# Patient Record
Sex: Female | Born: 1955 | Race: White | Hispanic: Refuse to answer | Marital: Single | Smoking: Former smoker
Health system: Southern US, Community
[De-identification: ages and names within clinical notes are randomized; demographics above are authoritative.]

## PROBLEM LIST (undated history)

## (undated) DIAGNOSIS — K219 Gastro-esophageal reflux disease without esophagitis: Secondary | ICD-10-CM

## (undated) DIAGNOSIS — L509 Urticaria, unspecified: Secondary | ICD-10-CM

## (undated) DIAGNOSIS — F419 Anxiety disorder, unspecified: Secondary | ICD-10-CM

## (undated) DIAGNOSIS — T7840XA Allergy, unspecified, initial encounter: Secondary | ICD-10-CM

## (undated) DIAGNOSIS — F32A Depression, unspecified: Secondary | ICD-10-CM

## (undated) DIAGNOSIS — F329 Major depressive disorder, single episode, unspecified: Secondary | ICD-10-CM

## (undated) HISTORY — PX: OTHER SURGICAL HISTORY: SHX169

## (undated) HISTORY — DX: Allergy, unspecified, initial encounter: T78.40XA

## (undated) HISTORY — DX: Anxiety disorder, unspecified: F41.9

## (undated) HISTORY — PX: TONGUE BIOPSY: SHX1075

## (undated) HISTORY — DX: Urticaria, unspecified: L50.9

---

## 2000-03-02 ENCOUNTER — Inpatient Hospital Stay (HOSPITAL_COMMUNITY): Admission: AD | Admit: 2000-03-02 | Discharge: 2000-03-02 | Payer: Self-pay | Admitting: Obstetrics & Gynecology

## 2000-04-13 ENCOUNTER — Other Ambulatory Visit: Admission: RE | Admit: 2000-04-13 | Discharge: 2000-04-13 | Payer: Self-pay | Admitting: Obstetrics & Gynecology

## 2001-05-25 ENCOUNTER — Other Ambulatory Visit: Admission: RE | Admit: 2001-05-25 | Discharge: 2001-05-25 | Payer: Self-pay | Admitting: Obstetrics & Gynecology

## 2002-12-06 ENCOUNTER — Other Ambulatory Visit: Admission: RE | Admit: 2002-12-06 | Discharge: 2002-12-06 | Payer: Self-pay | Admitting: Obstetrics & Gynecology

## 2004-01-02 ENCOUNTER — Other Ambulatory Visit: Admission: RE | Admit: 2004-01-02 | Discharge: 2004-01-02 | Payer: Self-pay | Admitting: Obstetrics & Gynecology

## 2004-09-19 ENCOUNTER — Ambulatory Visit (HOSPITAL_COMMUNITY): Admission: RE | Admit: 2004-09-19 | Discharge: 2004-09-19 | Payer: Self-pay | Admitting: Orthopedic Surgery

## 2004-09-23 ENCOUNTER — Ambulatory Visit (HOSPITAL_COMMUNITY): Admission: RE | Admit: 2004-09-23 | Discharge: 2004-09-23 | Payer: Self-pay | Admitting: Orthopedic Surgery

## 2004-10-24 ENCOUNTER — Ambulatory Visit (HOSPITAL_BASED_OUTPATIENT_CLINIC_OR_DEPARTMENT_OTHER): Admission: RE | Admit: 2004-10-24 | Discharge: 2004-10-24 | Payer: Self-pay | Admitting: Otolaryngology

## 2004-10-24 ENCOUNTER — Encounter (INDEPENDENT_AMBULATORY_CARE_PROVIDER_SITE_OTHER): Payer: Self-pay | Admitting: Specialist

## 2004-10-24 ENCOUNTER — Ambulatory Visit (HOSPITAL_COMMUNITY): Admission: RE | Admit: 2004-10-24 | Discharge: 2004-10-24 | Payer: Self-pay | Admitting: Otolaryngology

## 2004-10-29 ENCOUNTER — Encounter: Admission: RE | Admit: 2004-10-29 | Discharge: 2004-10-29 | Payer: Self-pay | Admitting: Otolaryngology

## 2004-12-05 ENCOUNTER — Encounter (INDEPENDENT_AMBULATORY_CARE_PROVIDER_SITE_OTHER): Payer: Self-pay | Admitting: *Deleted

## 2004-12-05 ENCOUNTER — Encounter: Payer: Self-pay | Admitting: Gastroenterology

## 2004-12-05 ENCOUNTER — Ambulatory Visit (HOSPITAL_COMMUNITY): Admission: RE | Admit: 2004-12-05 | Discharge: 2004-12-06 | Payer: Self-pay | Admitting: Otolaryngology

## 2004-12-31 ENCOUNTER — Encounter: Payer: Self-pay | Admitting: Gastroenterology

## 2005-01-13 ENCOUNTER — Encounter: Payer: Self-pay | Admitting: Gastroenterology

## 2005-01-18 ENCOUNTER — Encounter: Payer: Self-pay | Admitting: Gastroenterology

## 2005-01-22 ENCOUNTER — Other Ambulatory Visit: Admission: RE | Admit: 2005-01-22 | Discharge: 2005-01-22 | Payer: Self-pay | Admitting: Obstetrics & Gynecology

## 2010-06-13 ENCOUNTER — Encounter (INDEPENDENT_AMBULATORY_CARE_PROVIDER_SITE_OTHER): Payer: Self-pay | Admitting: *Deleted

## 2010-07-28 ENCOUNTER — Ambulatory Visit: Payer: Self-pay | Admitting: Gastroenterology

## 2010-07-28 DIAGNOSIS — K219 Gastro-esophageal reflux disease without esophagitis: Secondary | ICD-10-CM | POA: Insufficient documentation

## 2010-07-28 DIAGNOSIS — R1310 Dysphagia, unspecified: Secondary | ICD-10-CM | POA: Insufficient documentation

## 2010-07-28 DIAGNOSIS — R197 Diarrhea, unspecified: Secondary | ICD-10-CM | POA: Insufficient documentation

## 2010-07-29 ENCOUNTER — Ambulatory Visit: Payer: Self-pay | Admitting: Gastroenterology

## 2010-08-05 ENCOUNTER — Encounter: Payer: Self-pay | Admitting: Gastroenterology

## 2010-08-07 ENCOUNTER — Telehealth: Payer: Self-pay | Admitting: Gastroenterology

## 2010-08-12 DIAGNOSIS — K222 Esophageal obstruction: Secondary | ICD-10-CM | POA: Insufficient documentation

## 2010-08-25 ENCOUNTER — Telehealth: Payer: Self-pay | Admitting: Gastroenterology

## 2010-08-27 ENCOUNTER — Encounter: Payer: Self-pay | Admitting: Gastroenterology

## 2010-10-28 ENCOUNTER — Ambulatory Visit
Admission: RE | Admit: 2010-10-28 | Discharge: 2010-10-28 | Payer: Self-pay | Source: Home / Self Care | Attending: Gastroenterology | Admitting: Gastroenterology

## 2010-10-28 ENCOUNTER — Encounter: Payer: Self-pay | Admitting: Gastroenterology

## 2010-11-04 NOTE — Procedures (Signed)
Summary: Upper Endoscopy  Patient: Karen White Note: All result statuses are Final unless otherwise noted.  Tests: (1) Upper Endoscopy (EGD)   EGD Upper Endoscopy       DONE     [facility]     [address]     [place], [place]  [address]           ENDOSCOPY PROCEDURE REPORT           PATIENT:  Karen White, Karen White  MR#:  [id number]     BIRTHDATE:  [birth year], 53 yrs. old  GENDER:  female           ENDOSCOPIST:  Barbette Hair. Arlyce Dice, MD     Referred by:           PROCEDURE DATE:  07/29/2010     PROCEDURE:  EGD with biopsy, 432[address], Maloney Dilation of Esophagus     ASA CLASS:  Class II     INDICATIONS:  reflux symptoms despite therapy, dysphagia           MEDICATIONS:   Fentanyl 75 mcg IV, Versed 9 mg IV, glycopyrrolate     (Robinal) 0.2 mg IV, 0.6cc simethancone 0.6 cc PO     TOPICAL ANESTHETIC:  Exactacain Spray           DESCRIPTION OF PROCEDURE:   After the risks benefits and     alternatives of the procedure were thoroughly explained, informed     consent was obtained.  The LB GIF-H180 [id number] endoscope was     introduced through the mouth and advanced to the third portion of     the duodenum, without limitations.  The instrument was slowly     withdrawn as the mucosa was fully examined.     <<PROCEDUREIMAGES>>           Esophagitis was found. z line at 36cm, slightly irregular     (possible erosions) Bxs taken to r/o barrett's esophagus (see     image2 and image3).  A stricture was found at the gastroesophageal     junction. Early esophageal stricture Dilation with maloney dilator     18mm Minor resistance; no heme  Otherwise the examination was     normal.    Retroflexed views revealed no abnormalities.    The     scope was then withdrawn from the patient and the procedure     completed.           COMPLICATIONS:  None           ENDOSCOPIC IMPRESSION:     1) Esophagitis     2) Stricture at the gastroesophageal junction - s/p maloney     dilitation     3)  Otherwise normal examination     RECOMMENDATIONS:     1) Await biopsy results     2) continue PPI - DEXILANT     3) Call office next 2-3 days to schedule an office appointment     for 3-4 weeks           REPEAT EXAM:  No           ______________________________     Barbette Hair. Arlyce Dice, MD           CC:  Samuel Jester           n.     eSIGNED:   Barbette Hair. Kaplan at 07/29/2010 04:29 PM           [place],  [name], [name] [phone]  Note: An exclamation mark (!) indicates a result that was not dispersed into the flowsheet. Document Creation Date: 07/29/2010 4:29 PM _______________________________________________________________________  (1) Order result status: Final Collection or observation date-time: 07/29/2010 16:22 Requested date-time:  Receipt date-time:  Reported date-time:  Referring Physician:   Ordering Physician: Melvia Heaps [id number]) Specimen Source:  Source: Launa Grill Order Number: [id number] Lab site:   Appended Document: Upper Endoscopy    Clinical Lists Changes  Problems: Added new problem of ESOPHAGEAL STRICTURE (ICD-530.3)

## 2010-11-04 NOTE — Medication Information (Signed)
Summary: Prior Auth/[employer]  Prior Auth/medco   Imported By: Lester [place] 09/05/2010 10:58:25  _____________________________________________________________________  External Attachment:    Type:   Image     Comment:   External Document

## 2010-11-04 NOTE — Progress Notes (Signed)
Summary: med concern  Phone Note Call from Patient Call back at [phone]   Caller: Patient Call For: Dr. Arlyce Dice Reason for Call: Talk to Nurse Summary of Call: having trouble getting Dexilant filled... [employer], [phone] Initial call taken by: Vallarie Mare,  August 25, 2010 3:46 PM  Follow-up for Phone Call        Called at pharmacy they are to fax over a prior authorization request. Follow-up by: Merri Ray CMA Duncan Dull),  August 25, 2010 4:31 PM  Additional Follow-up for Phone Call Additional follow up Details #1::        Tried to contact pt, no answer Additional Follow-up by: Merri Ray CMA Duncan Dull),  August 25, 2010 4:32 PM    Additional Follow-up for Phone Call Additional follow up Details #2::    Pt will come ppick u[p samples tomorrow before 2pm. Until her medication is approved through her insurance Follow-up by: Merri Ray CMA Duncan Dull),  August 26, 2010 9:17 AM

## 2010-11-04 NOTE — Op Note (Signed)
Summary: [place]  [place]   Imported By: Sherian Rein 07/31/2010 10:32:32  _____________________________________________________________________  External Attachment:    Type:   Image     Comment:   External Document

## 2010-11-04 NOTE — Assessment & Plan Note (Signed)
Summary: persistant GERD--ch.   History of Present Illness Visit Type: Initial Consult Primary GI MD: Melvia Heaps MD [facility] Primary Provider: Samuel Jester, MD Requesting Provider: Samuel Jester, MD Chief Complaint: Persistent GERD History of Present Illness:   Karen White is a  pleasant 55 year old white female referred at the request of Dr. Charm Barges for evaluation of throat burning and diarrhea.  For the past year she has been suffering from severe throat burning associated with hoarseness, pyrosis and coughing.  She has intermittent dysphagia to solids.  She has taken various PPIs including Nexium and Prilosec.  She is on no gastric irritants including nonsteroidals.  She complains of excess gas, bloating and belching.  For the past year she has also been complaining of frequent diarrhea.  Symptoms are intermittent consisting of watery stools with urgency.  She has kept her food diary and cannot pinpoint any particular food stuffs.  There is no history of melena or hematochezia.  Symptoms are coincident with her upper GI complaints.   GI Review of Systems    Reports acid reflux, belching, and  bloating.      Denies abdominal pain, chest pain, dysphagia with liquids, dysphagia with solids, heartburn, loss of appetite, nausea, vomiting, vomiting blood, weight loss, and  weight gain.      Reports irritable bowel syndrome.     Denies anal fissure, black tarry stools, change in bowel habit, constipation, diarrhea, diverticulosis, fecal incontinence, heme positive stool, hemorrhoids, jaundice, light color stool, liver problems, rectal bleeding, and  rectal pain. Preventive Screening-Counseling & Management  Alcohol-Tobacco     Smoking Status: never      Drug Use:  no.      Current Medications (verified): 1)  Prilosec 20 Mg Cpdr (Omeprazole) .Marland Kitchen.. 1 By Mouth Once Daily 2)  Nexium 40 Mg Cpdr (Esomeprazole Magnesium) .Marland Kitchen.. 1 By Mouth Two Times A Day 3)  Acidophilus  Caps (Lactobacillus) .Marland Kitchen..  1 By Mouth Once Daily 4)  Lexapro 10 Mg Tabs (Escitalopram Oxalate) .Marland Kitchen.. 1 By Mouth Once Daily 5)  Gianvi 3-0.02 Mg Tabs (Drospirenone-Ethinyl Estradiol) .Marland Kitchen.. 1 By Mouth Once Daily 6)  Maxalt 10 Mg Tabs (Rizatriptan Benzoate) .... Take As Needed Headache 7)  Alprazolam 0.5 Mg Tabs (Alprazolam) .... Take As Needed At Bedtime Sleep  Allergies (verified): No Known Drug Allergies  Past History:  Past Medical History: Chronic Headaches GERD  Past Surgical History: Tongue biopsy x 3  Removal of Schwannoma Tumor  Family History: Reviewed history and no changes required. Family History of Heart Disease: father Vulvar Ca and Lung Cancer-mother No FH of Colon Cancer: Family History of Ovarian Cancer:MGM  Social History: Reviewed history and no changes required. divorced 2 girls Patient has never smoked.  Alcohol Use - yes   1 weekly Daily Caffeine Use  1-3 per day Illicit Drug Use - no Smoking Status:  never Drug Use:  no  Review of Systems       The patient complains of cough.  The patient denies allergy/sinus, anemia, anxiety-new, arthritis/joint pain, back pain, blood in urine, breast changes/lumps, change in vision, confusion, coughing up blood, depression-new, fainting, fatigue, fever, headaches-new, hearing problems, heart murmur, heart rhythm changes, itching, menstrual pain, muscle pains/cramps, night sweats, nosebleeds, pregnancy symptoms, shortness of breath, skin rash, sleeping problems, sore throat, swelling of feet/legs, swollen lymph glands, thirst - excessive , urination - excessive , urination changes/pain, urine leakage, vision changes, and voice change.         All other systems were reviewed and  were negative   Vital Signs:  Patient profile:   55 year old female Height:      62 inches Weight:      149 pounds BMI:     27.35 Pulse rate:   84 / minute Pulse rhythm:   regular BP sitting:   122 / 80  (left arm)  Vitals Entered By: Milford Cage NCMA (July 28, 2010 3:56 PM)  Physical Exam  Additional Exam:  On physical exam she is a well-developed well-nourished female  skin: anicteric HEENT: normocephalic; PEERLA; no nasal or pharyngeal abnormalities neck: supple nodes: no cervical lymphadenopathy chest: clear to ausculatation and percussion heart: no murmurs, gallops, or rubs abd: soft, nontender; BS normoactive; no abdominal masses, tenderness, organomegaly rectal: deferred ext: no cynanosis, clubbing, edema skeletal: no deformities neuro: oriented x 3; no focal abnormalities    Impression & Recommendations:  Problem # 1:  ESOPHAGEAL REFLUX (ICD-530.81) Patient appears to have symptoms thus far refractory to medical therapy.  Recommendations #1 DEXILANT 60 mg one to 2 times a day.  Her first dose will be before dinner #2 upper endoscopy  #3 antireflux measures  Problem # 2:  DYSPHAGIA UNSPECIFIED (ICD-787.20) r/o early esophageal stricture Orders: EGD (EGD) with dilitation as indicated  Problem # 3:  DIARRHEA (ICD-787.91) Tthe patient's symptoms are very suggestive of IBS.  At the same time, her symptoms could be related to her high-dose PPI therapy.  Recommendations #1 switch from Nexium and Prilosec to once a day DEXILANT #2 to consider colonoscopy pending  response to #1.  At the same time, the patient should  undergo colonoscopy for colorectal cancer screening, which will be done at a later point.  Patient Instructions: 1)  Copy sent to : Samuel Jester, MD 2)  Your EGD is scheduled on 07/29/2010 at 3:30pm 3)  We are giving you Dexilant samples today 4)  The medication list was reviewed and reconciled.  All changed / newly prescribed medications were explained.  A complete medication list was provided to the patient / caregiver.

## 2010-11-04 NOTE — Letter (Signed)
Summary: [facility]   Imported By: Sherian Rein 07/31/2010 10:35:33  _____________________________________________________________________  External Attachment:    Type:   Image     Comment:   External Document

## 2010-11-04 NOTE — Progress Notes (Signed)
Summary: results request  Phone Note Call from Patient Call back at [phone]   Caller: Patient Call For: Dr. Arlyce Dice Reason for Call: Talk to Nurse Summary of Call: would like biopsy results and to discuss getting an rx for Dexilant Initial call taken by: Vallarie Mare,  August 07, 2010 11:30 AM  Follow-up for Phone Call        I have left a voicemail for the patient that I have sent her a rx for dexilant.  I reviewed her bx were fine and only showed inflammation.  She is asked to call back for any questions or concerns. Follow-up by: Darcey Nora RN, CGRN,  August 07, 2010 11:36 AM    Prescriptions: DEXILANT 60 MG CPDR (DEXLANSOPRAZOLE) 1 by mouth once daily  #30 x 6   Entered by:   Darcey Nora RN, CGRN   Authorized by:   Louis Meckel MD   Signed by:   Darcey Nora RN, CGRN on 08/07/2010   Method used:   Electronically to        [facility] (retail)       [address]       [place], [place]  [address]       Ph: [phone]       Fax: [phone]   RxID:   [id number]

## 2010-11-04 NOTE — Letter (Signed)
Summary: Results Letter  [facility]  [address] [place], [place] [address]   Phone: [phone]  Fax: [phone]        July 28, 2010 MRN: [id number]    [facility] [place] [address] [place], [place]  [address]    Dear Ms. Cullifer,  It is my pleasure to have treated you recently as a new patient in my office. I appreciate your confidence and the opportunity to participate in your care.  Since I do have a busy inpatient endoscopy schedule and office schedule, my office hours vary weekly. I am, however, available for emergency calls everyday through my office. If I am not available for an urgent office appointment, another one of our gastroenterologist will be able to assist you.  My well-trained staff are prepared to help you at all times. For emergencies after office hours, a physician from our Gastroenterology section is always available through my 24 hour answering service  Once again I welcome you as a new patient and I look forward to a happy and healthy relationship             Sincerely,  Louis Meckel MD  This letter has been electronically signed by your physician.  Appended Document: Results Letter LETTER MAILED

## 2010-11-04 NOTE — Letter (Signed)
Summary: Results Letter  [facility]  [address] [place], [place] [address]   Phone: [phone]  Fax: [phone]        August 05, 2010 MRN: [id number]    [facility] [place] [address] [place], [place]  [address]    Dear Ms. Abrahamsen,   Your biopsies demonstrated inflammatory changes only.    Please follow the recommendations previously discussed.  Should you have any immediate concerns or questions, feel free to contact me at the office.    Sincerely,  Barbette Hair. Arlyce Dice, M.D., [employer]          Sincerely,  Louis Meckel MD  This letter has been electronically signed by your physician.  Appended Document: Results Letter letter mailed

## 2010-11-04 NOTE — Letter (Signed)
Summary: [facility]   Imported By: Sherian Rein 07/31/2010 10:30:19  _____________________________________________________________________  External Attachment:    Type:   Image     Comment:   External Document

## 2010-11-04 NOTE — Medication Information (Signed)
Summary: Approved Dexilant / [employer]  Approved Dexilant / [employer]   Imported By: Lennie Odor 09/04/2010 10:47:18  _____________________________________________________________________  External Attachment:    Type:   Image     Comment:   External Document

## 2010-11-04 NOTE — Letter (Signed)
Summary: New Patient letter  [facility]  [address] [place], [place] [address]   Phone: [phone]  Fax: [phone]       06/13/2010 MRN: [id number]  [facility] [place] [address] [place], [place]  [address]  Dear Ms. Todisco,  Welcome to the Gastroenterology Division at [facility].    You are scheduled to see Dr.  Arlyce Dice on 07-28-10 at 3:45P.M. on the 3rd floor at [facility], [address]  We ask that you try to arrive at our office 15 minutes prior to your appointment time to allow for check-in.  We would like you to complete the enclosed self-administered evaluation form prior to your visit and bring it with you on the day of your appointment.  We will review it with you.  Also, please bring a complete list of all your medications or, if you prefer, bring the medication bottles and we will list them.  Please bring your insurance card so that we may make a copy of it.  If your insurance requires a referral to see a specialist, please bring your referral form from your primary care physician.  Co-payments are due at the time of your visit and may be paid by cash, check or credit card.     Your office visit will consist of a consult with your physician (includes a physical exam), any laboratory testing he/she may order, scheduling of any necessary diagnostic testing (e.g. x-ray, ultrasound, CT-scan), and scheduling of a procedure (e.g. Endoscopy, Colonoscopy) if required.  Please allow enough time on your schedule to allow for any/all of these possibilities.    If you cannot keep your appointment, please call [phone] to cancel or reschedule prior to your appointment date.  This allows [place] the opportunity to schedule an appointment for another patient in need of care.  If you do not cancel or reschedule by 5 p.m. the business day prior to your appointment date, you will be charged a $50.00 late cancellation/no-show fee.    Thank you for choosing  [facility] for your medical needs.  We appreciate the opportunity to care for you.  Please visit [place] at our website  to learn more about our practice.                     Sincerely,                                                             The Gastroenterology Division

## 2010-11-04 NOTE — Letter (Signed)
Summary: NP Eval/[facility]  NP Eval/[facility]   Imported By: Sherian Rein 07/31/2010 10:33:55  _____________________________________________________________________  External Attachment:    Type:   Image     Comment:   External Document

## 2010-11-04 NOTE — Letter (Signed)
Summary: EGD Instructions  [facility]  [address] [place], [place] [address]   Phone: [phone]  Fax: [phone]       Karen White    Oct 23, 1955    MRN: [id number]       Procedure Day /Date:TUESDAY 07/29/2010     Arrival Time: 2:30PM     Procedure Time:3:30PM     Location of Procedure:                    X  [facility] (4th Floor)  PREPARATION FOR ENDOSCOPY   On10/25 THE DAY OF THE PROCEDURE:  1.   No solid foods, milk or milk products are allowed after midnight the night before your procedure.  2.   Do not drink anything colored red or purple.  Avoid juices with pulp.  No orange juice.  3.  You may drink clear liquids until12:30PM, which is 2 hours before your procedure.                                                                                                CLEAR LIQUIDS INCLUDE: Water Jello Ice Popsicles Tea (sugar ok, no milk/cream) Powdered fruit flavored drinks Coffee (sugar ok, no milk/cream) Gatorade Juice: apple, white grape, white cranberry  Lemonade Clear bullion, consomm, broth Carbonated beverages (any kind) Strained chicken noodle soup Hard Candy   MEDICATION INSTRUCTIONS  Unless otherwise instructed, you should take regular prescription medications with a small sip of water as early as possible the morning of your procedure.           OTHER INSTRUCTIONS  You will need a responsible adult at least 54 years of age to accompany you and drive you home.   This person must remain in the waiting room during your procedure.  Wear loose fitting clothing that is easily removed.  Leave jewelry and other valuables at home.  However, you may wish to bring a book to read or an iPod/MP3 player to listen to music as you wait for your procedure to start.  Remove all body piercing jewelry and leave at home.  Total time from sign-in until discharge is approximately 2-3 hours.  You should go home directly after  your procedure and rest.  You can resume normal activities the day after your procedure.  The day of your procedure you should not:   Drive   Make legal decisions   Operate machinery   Drink alcohol   Return to work  You will receive specific instructions about eating, activities and medications before you leave.    The above instructions have been reviewed and explained to me by   _______________________    I fully understand and can verbalize these instructions _____________________________ Date _________

## 2010-11-06 NOTE — Assessment & Plan Note (Signed)
Summary: follow-up office visit   History of Present Illness Visit Type: Follow-up Visit Primary GI MD: Melvia Heaps MD [facility] Primary Provider: Samuel Jester, MD Requesting Provider: Samuel Jester, MD Chief Complaint: follow-up procedure  better on Dexilant History of Present Illness:   [name] has returned following endoscopy for reflux symptoms.  Esophagitis was seen.  On a regimen of DEXILANT symptoms have significantly improved.  This includes her diarrhea and cough.  An early esophageal stricture was dilated to 18 mm.   GI Review of Systems      Denies abdominal pain, acid reflux, belching, bloating, chest pain, dysphagia with liquids, dysphagia with solids, heartburn, loss of appetite, nausea, vomiting, vomiting blood, weight loss, and  weight gain.        Denies anal fissure, black tarry stools, change in bowel habit, constipation, diarrhea, diverticulosis, fecal incontinence, heme positive stool, hemorrhoids, irritable bowel syndrome, jaundice, light color stool, liver problems, rectal bleeding, and  rectal pain.    Current Medications (verified): 1)  Dexilant 60 Mg Cpdr (Dexlansoprazole) .Marland Kitchen.. 1 By Mouth Once Daily 2)  Acidophilus  Caps (Lactobacillus) .Marland Kitchen.. 1 By Mouth Once Daily 3)  Lexapro 10 Mg Tabs (Escitalopram Oxalate) .Marland Kitchen.. 1 By Mouth Once Daily 4)  Gianvi 3-0.02 Mg Tabs (Drospirenone-Ethinyl Estradiol) .Marland Kitchen.. 1 By Mouth Once Daily 5)  Maxalt 10 Mg Tabs (Rizatriptan Benzoate) .... Take As Needed Headache 6)  Alprazolam 0.5 Mg Tabs (Alprazolam) .... Take As Needed At Bedtime Sleep 7)  Prednisone 20 Mg Tabs (Prednisone) .Marland Kitchen.. 1 By Mouth Once Daily  Allergies (verified): 1)  ! Laren Everts  Past History:  Past Medical History: Reviewed history from 07/28/2010 and no changes required. Chronic Headaches GERD  Past Surgical History: Reviewed history from 07/28/2010 and no changes required. Tongue biopsy x 3  Removal of Schwannoma Tumor  Family History: Reviewed  history from 07/28/2010 and no changes required. Family History of Heart Disease: father Vulvar Ca and Lung Cancer-mother No FH of Colon Cancer: Family History of Ovarian Cancer:MGM  Social History: Reviewed history from 07/28/2010 and no changes required. divorced 2 girls Patient has never smoked.  Alcohol Use - yes   1 weekly Daily Caffeine Use  1-3 per day Illicit Drug Use - no  Review of Systems  The patient denies allergy/sinus, anemia, anxiety-new, arthritis/joint pain, back pain, blood in urine, breast changes/lumps, change in vision, confusion, cough, coughing up blood, depression-new, fainting, fatigue, fever, headaches-new, hearing problems, heart murmur, heart rhythm changes, itching, menstrual pain, muscle pains/cramps, night sweats, nosebleeds, pregnancy symptoms, shortness of breath, skin rash, sleeping problems, sore throat, swelling of feet/legs, swollen lymph glands, thirst - excessive , urination - excessive , urination changes/pain, urine leakage, vision changes, and voice change.    Vital Signs:  Patient profile:   55 year old female Height:      62 inches Weight:      150 pounds BMI:     27.53 Pulse rate:   84 / minute Pulse rhythm:   regular BP sitting:   118 / 70  (left arm)  Vitals Entered By: Milford Cage NCMA (October 28, 2010 3:16 PM)   Impression & Recommendations:  Problem # 1:  ESOPHAGEAL REFLUX (ICD-530.81) Assessment Improved Plan to continue DEXILANT 60 mg daily  Problem # 2:  ESOPHAGEAL STRICTURE (ICD-530.3) Assessment: Improved Repeat dilatation p.r.n.  Problem # 3:  DIARRHEA (ICD-787.91) Assessment: Improved  Problem # 4:  SPECIAL SCREENING FOR MALIGNANT NEOPLASMS COLON (ICD-V76.51) Plan screening colonoscopy  Patient Instructions: 1)  Copy sent to : Samuel Jester, MD 2)  Please call the office to schedule your screening colonoscopy at your convenience that was recomended by Dr Arlyce Dice 3)  The medication list was reviewed and  reconciled.  All changed / newly prescribed medications were explained.  A complete medication list was provided to the patient / caregiver.

## 2011-02-20 NOTE — Op Note (Signed)
NAMELENEA, BYWATER               ACCOUNT NO.:  [id number]   MEDICAL RECORD NO.:  [id number]          PATIENT TYPE:  OIB   LOCATION:  5741                         FACILITY:  [facility]   PHYSICIAN:  Lucky Cowboy, MD         DATE OF BIRTH:  13-Jul-1956   DATE OF PROCEDURE:  12/05/2004  DATE OF DISCHARGE:                                 OPERATIVE REPORT   PREOPERATIVE DIAGNOSIS:  Tongue base mass.   POSTOPERATIVE DIAGNOSIS:  Tongue base mass.   PROCEDURE:  Tongue base biopsy.   SURGEON:  Lucky Cowboy, MD   ANESTHESIA:  General endotracheal anesthesia.   ESTIMATED BLOOD LOSS:  Less than 30 cc.   SPECIMEN:  Biopsies of tongue base mass   ANESTHESIA:  General.   COMPLICATIONS:  None.   INDICATIONS:  The patient is a 55 year old female who was noted to have a  large left-sided tongue base mass on incidental finding for a MRI for  cervical spine symptoms.  A previous biopsy was without abnormality and for  this reason, the patient is returned to the operating room for further  biopsying.   FINDINGS:  The patient was noted to have a large palpable left-sided tongue  base mass.  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   PROCEDURE:  The patient was taken to the operating room and placed on the  table in the supine position.  She was then placed under general  endotracheal anesthesia and a bite block placed in between the left central  incisors.  A towel clip was placed in the tongue base and extended  anteriorly.  The mass was palpated at just the left side posterior to the  circumvallate papillae.  Bovie cautery was used to make an incision in the  anterior-to-posterior direction of approximately 1 cm.  Large cup forceps  were used to biopsy down onto the palpable mass.  Initially, this was  returned as atypical squamous mucosa and normal tongue muscle.  Again,  deeper biopsies to palpation were performed.  A more granular and definite  mass was  palpated and biopsied again.  Further, the ultrasound was used to  definitively identify the site and the ultrasound was placed over the left  submandibular triangle with biopsy forceps placed within the depth of the  mass.  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  The final biopsy ultrasound-guided specimen was sent  for permanent section.  The patient was then awakened from anesthesia and  taken to the postanesthesia care unit in stable condition.  There were no  complications.      SJ/MEDQ  D:  12/05/2004  T:  12/06/2004  Job:  [id number]   cc:   Katy Fitch. Naaman Plummer., M.D.  [address]  [place]  [place] [address]  Fax: [phone]   [place], [place] Gardner Candle DDS   c/o Dr. Waldon Merl, [place]

## 2011-02-20 NOTE — Op Note (Signed)
NAMECALEEN, Karen White               ACCOUNT NO.:  [id number]   MEDICAL RECORD NO.:  [id number]          PATIENT TYPE:  OUT   LOCATION:  DFTL                         FACILITY:  [facility]   PHYSICIAN:  Lucky Cowboy, MD         DATE OF BIRTH:  [birth year]   DATE OF PROCEDURE:  10/24/2004  DATE OF DISCHARGE:  10/24/2004                                 OPERATIVE REPORT   PREOPERATIVE DIAGNOSIS:  Tongue base mass.   POSTOPERATIVE DIAGNOSIS:  Tongue base mass.   PROCEDURE:  Direct laryngoscopy with tongue base biopsy.   SURGEON:  Lucky Cowboy, MD   ANESTHESIA:  General.   ESTIMATED BLOOD LOSS:  Less than 30 cc.   SPECIMENS:  Tongue base biopsy.   COMPLICATIONS:  None.   INDICATIONS:  This patient is a 55 year old female who was noted to have an  incidental finding of a left-sided tongue base mass, which measured  approximately 3 cm in AP direction by 2 cm in transverse direction.  This  was noted incidentally while working up the cervical spine by MRI scan.  The  patient is currently asymptomatic.  For these reasons, the patient is taken  to the operating room for further evaluation.   FINDINGS:  The patient was noted to have a large, firm bimanual palpable  mass.  Frozen sections were without abnormality.   DESCRIPTION OF PROCEDURE:  The patient was taken to the operating room and  placed on the table in the supine position.  She was then placed under  general endotracheal anesthesia and the table rotated counter clockwise 90  degrees.  Head and body were draped in the usual fashion.  Upper dental  guard was placed.  The neck was gently extended using a shoulder roll.  Anterior commissure Holinger laryngoscope was used to visualize the  endolarynx.  There were no obvious abnormalities identified.  There was  moderate asymmetry of a left tongue base.  Bimanual palpation of the left  submandibular triangle with one hand at the left tongue base, revealed that  you could feel the mass  between the two tips of the fingers.  With  compression on the left side in the submandibular triangle, cup forceps were  used to bury into the deep lingual tonsil and obtain deep bites within the  left tongue base.  Frozen section revealed benign squamous mucosa and  lymphoid tissue.  Also, Tru-Cut forceps were applied in this area, with an  attempt to go deeper.  This revealed benign skeletal muscle.   At this point, a cutdown was made using a laryngeal sickle knife over the  tongue base on the left lingual tonsillar area.  The large cup forceps were  used to enter the tongue base with bimanual compression.  The mass was  entered  in this fashion.  Permanent sections were taken.  There was minimal  bleeding.  The table was then rotated clockwise 90 degrees to its original  position.  The patient was awakened from anesthesia and taken to the post-  anesthesia care unit in stable condition.  There were  no complications.      SJ/MEDQ  D:  11/17/2004  T:  11/17/2004  Job:  [id number]   cc:   Katy Fitch. Naaman Plummer., M.D.  [address]  [place]  [place] [address]  Fax: [phone]

## 2011-08-21 ENCOUNTER — Ambulatory Visit (HOSPITAL_COMMUNITY)
Admission: RE | Admit: 2011-08-21 | Discharge: 2011-08-21 | Disposition: A | Discharge status: Home / Self Care | Payer: BC Managed Care – PPO | Source: Ambulatory Visit | Attending: Physical Medicine and Rehabilitation | Admitting: Physical Medicine and Rehabilitation

## 2011-08-21 DIAGNOSIS — M6281 Muscle weakness (generalized): Secondary | ICD-10-CM | POA: Insufficient documentation

## 2011-08-21 DIAGNOSIS — IMO0001 Reserved for inherently not codable concepts without codable children: Secondary | ICD-10-CM | POA: Insufficient documentation

## 2011-08-21 DIAGNOSIS — M542 Cervicalgia: Secondary | ICD-10-CM | POA: Insufficient documentation

## 2011-08-21 NOTE — Progress Notes (Signed)
Physical Therapy Evaluation  Patient Details  Name: Karen White MRN: [id number] Date of Birth: 1956/06/09  Today's Date: 08/21/2011 Time: 4098-1191 Time Calculation (min): 43 min Charges: 1 eval Visit#: 1  of 8   Re-eval: 09/20/11    Past Medical History: No past medical history on file. Past Surgical History: No past surgical history on file.  Subjective Symptoms/Limitations Symptoms: Pt is a 55 y.o.female referred to PT for cervical pain.  Pt reports that her pain started in late July and the pain is on the R side of her neck.  She has tried over the door traction,,e-stim, massage and has sought help from a chiropractor and has not had any relief.  It runs from the base of her neck to her shoulder.  She is having difficulty getting comfortable at night.  She is taking Flexiril. Does not feel like it is a migraine.  She has sharp shooting pains. No dizziness. R handed numbness.MEDICAL HISTORY: Migraines, Family history of Mythenia Gravis Special Tests: + Distraction, + Compression, + Bakody's, + Phalens + Roo's Pain Assessment Currently in Pain?: Yes Pain Score:   6 Pain Location: Neck Pain Orientation: Right Pain Type: Acute pain  Precautions/Restrictions     Prior Functioning     Sensation/Coordination/Flexibility    Assessment    Exercise/Treatments Stretches Upper Trapezius Stretch: 30 seconds Levator Stretch: 30 seconds Corner Stretch: 30 seconds Neck Exercises Neck Retraction: 10 reps W Back: 10 reps X to V: 10 reps  Physical Therapy Assessment and Plan PT Assessment and Plan Clinical Impression Statement: Pt is a 55 y.o.female  referred to PT for cervical pain.  After examination it was found that the patient has current body structure impairments of increased pain, decreased ROM, decreased postural strength, impaired posture and increased scalene spams, which are limiting her ability to participate in community and work related activities.  Pt will  benefit from skilled PT service to address the above body structure impairments in order to maximize function in order to improve quality of life. Rehab Potential: Good PT Frequency: Min 2X/week PT Duration: 4 weeks PT Treatment/Interventions: Therapeutic exercise;Neuromuscular re-education;Patient/family education PT Plan: Add: UBE, x-v, w-backs, motor training activities    Goals    Problem List Patient Active Problem List  Diagnoses  . ESOPHAGEAL STRICTURE  . ESOPHAGEAL REFLUX  . DYSPHAGIA UNSPECIFIED  . DIARRHEA  . Cervicalgia    PT - End of Session Activity Tolerance: Patient tolerated treatment well   Escher Harr 08/21/2011, 7:17 PM  Physician Documentation Your signature is required to indicate approval of the treatment plan as stated above.  Please sign and either send electronically or make a copy of this report for your files and return this physician signed original.   Please mark one 1.__approve of plan  2. ___approve of plan with the following conditions.   ______________________________                                                          _____________________ Physician Signature  Date  

## 2011-08-25 ENCOUNTER — Ambulatory Visit (HOSPITAL_COMMUNITY): Payer: BC Managed Care – PPO | Admitting: Physical Therapy

## 2011-08-31 ENCOUNTER — Ambulatory Visit (HOSPITAL_COMMUNITY)
Admission: RE | Admit: 2011-08-31 | Discharge: 2011-08-31 | Disposition: A | Discharge status: Home / Self Care | Payer: BC Managed Care – PPO | Source: Ambulatory Visit | Attending: *Deleted | Admitting: *Deleted

## 2011-08-31 NOTE — Progress Notes (Signed)
Physical Therapy Treatment Patient Details  Name: ARELI FRARY MRN: [id number] Date of Birth: 05-13-1956  Today's Date: 08/31/2011 Time: 6578-4696 Time Calculation (min): 53 min Visit#: 2  of 8   Re-eval: 09/20/11 Charges: Therex x 34' Manual x 10'  Subjective: Symptoms/Limitations Symptoms: Pt reports some pain in R cervical area that she attributes to looking down at computers at work. Pain Assessment Currently in Pain?: Yes Pain Score:   6 Pain Location: Neck Pain Orientation: Right   Exercise/Treatments Stretches Upper Trapezius Stretch: 3 reps;30 seconds Levator Stretch: 3 reps;30 seconds Corner Stretch: 3 reps;30 seconds Neck Exercises Neck Retraction: 10 reps W Back: 10 reps X to V: 10 reps  Manual Therapy Manual Therapy: Other (comment) Myofascial Release: MFR w/STM to R upper trap to decrease pain/tightness to decrease pain x 10'  Physical Therapy Assessment and Plan PT Assessment and Plan Clinical Impression Statement: Pt with c/o tightness with exercises. Mm tightness noted in R upper trap. MFR w/ STM to R upper trap to decrease pain tightness added after approval by Annett Fabian, PT. Pt states that pain remained at 6/10 at end of session but reports decreased tightness in cervical area. PT Treatment/Interventions: Therapeutic exercise;Other (comment) (Manual) PT Plan: Continue to progress per PT POC.     Problem List Patient Active Problem List  Diagnoses  . ESOPHAGEAL STRICTURE  . ESOPHAGEAL REFLUX  . DYSPHAGIA UNSPECIFIED  . DIARRHEA  . Cervicalgia    PT - End of Session Activity Tolerance: Patient tolerated treatment well General Behavior During Session: [facility] for tasks performed Cognition: [facility] for tasks performed  Antonieta Iba 08/31/2011, 5:47 PM

## 2011-09-02 ENCOUNTER — Telehealth (HOSPITAL_COMMUNITY): Payer: Self-pay

## 2011-09-02 ENCOUNTER — Ambulatory Visit (HOSPITAL_COMMUNITY): Payer: BC Managed Care – PPO | Admitting: Physical Therapy

## 2011-09-07 ENCOUNTER — Ambulatory Visit (HOSPITAL_COMMUNITY): Payer: BC Managed Care – PPO

## 2011-09-09 ENCOUNTER — Ambulatory Visit (HOSPITAL_COMMUNITY): Payer: BC Managed Care – PPO | Admitting: Physical Therapy

## 2011-09-14 ENCOUNTER — Ambulatory Visit (HOSPITAL_COMMUNITY): Payer: BC Managed Care – PPO | Admitting: *Deleted

## 2011-09-14 ENCOUNTER — Telehealth (HOSPITAL_COMMUNITY): Payer: Self-pay

## 2011-09-16 ENCOUNTER — Ambulatory Visit (HOSPITAL_COMMUNITY): Payer: BC Managed Care – PPO | Admitting: Physical Therapy

## 2011-09-21 ENCOUNTER — Ambulatory Visit (HOSPITAL_COMMUNITY): Payer: BC Managed Care – PPO | Admitting: Physical Therapy

## 2011-09-23 ENCOUNTER — Ambulatory Visit (HOSPITAL_COMMUNITY): Payer: BC Managed Care – PPO | Admitting: Physical Therapy

## 2011-10-01 ENCOUNTER — Other Ambulatory Visit: Payer: Self-pay | Admitting: Gastroenterology

## 2011-10-02 MED ORDER — DEXLANSOPRAZOLE 60 MG PO CPDR: DELAYED_RELEASE_CAPSULE | ORAL | Status: DC

## 2011-10-02 NOTE — Telephone Encounter (Signed)
Prior auth for Dexilant approved for 1 year. Called [facility] in [place], [place] [address] and order went thru. lmom for pt to call me back.

## 2011-10-02 NOTE — Telephone Encounter (Signed)
Spoke with pt who stated the pharmacy still call her PCP for prior auth for Dexilant and it is being denied. I informed her we have no samples, but I will try to get the prior auth for her. Called [facility], [phone]; pt id. [id number] and someone had answered no to prior therapy of PPIs. Per 07/28/2010 note, pt has tried Nexium bid and Omeprazole w/o any help.

## 2011-10-05 ENCOUNTER — Telehealth: Payer: Self-pay | Admitting: *Deleted

## 2011-10-05 NOTE — Telephone Encounter (Signed)
Patient approved for Dexilant 09/11/2011 until 10/01/2012

## 2011-12-03 ENCOUNTER — Other Ambulatory Visit: Payer: Self-pay

## 2011-12-03 ENCOUNTER — Emergency Department (HOSPITAL_COMMUNITY): Payer: BC Managed Care – PPO

## 2011-12-03 ENCOUNTER — Encounter (HOSPITAL_COMMUNITY): Payer: Self-pay

## 2011-12-03 ENCOUNTER — Emergency Department (HOSPITAL_COMMUNITY)
Admission: EM | Admit: 2011-12-03 | Discharge: 2011-12-03 | Disposition: A | Discharge status: Home / Self Care | Payer: BC Managed Care – PPO | Attending: Emergency Medicine | Admitting: Emergency Medicine

## 2011-12-03 DIAGNOSIS — R6884 Jaw pain: Secondary | ICD-10-CM | POA: Insufficient documentation

## 2011-12-03 DIAGNOSIS — M25519 Pain in unspecified shoulder: Secondary | ICD-10-CM | POA: Insufficient documentation

## 2011-12-03 DIAGNOSIS — R079 Chest pain, unspecified: Secondary | ICD-10-CM | POA: Insufficient documentation

## 2011-12-03 DIAGNOSIS — K219 Gastro-esophageal reflux disease without esophagitis: Secondary | ICD-10-CM | POA: Insufficient documentation

## 2011-12-03 HISTORY — DX: Major depressive disorder, single episode, unspecified: F32.9

## 2011-12-03 HISTORY — DX: Gastro-esophageal reflux disease without esophagitis: K21.9

## 2011-12-03 HISTORY — DX: Depression, unspecified: F32.A

## 2011-12-03 LAB — COMPREHENSIVE METABOLIC PANEL
ALT: 45 U/L — ABNORMAL HIGH (ref 0–35)
AST: 33 U/L (ref 0–37)
Albumin: 3.8 g/dL (ref 3.5–5.2)
Alkaline Phosphatase: 122 U/L — ABNORMAL HIGH (ref 39–117)
BUN: 21 mg/dL (ref 6–23)
Calcium: 10.1 mg/dL (ref 8.4–10.5)
Chloride: 106 mEq/L (ref 96–112)
GFR calc Af Amer: 70 mL/min — ABNORMAL LOW (ref >90)
GFR calc non Af Amer: 60 mL/min — ABNORMAL LOW (ref >90)
Glucose, Bld: 72 mg/dL (ref 70–99)
Potassium: 3.7 mEq/L (ref 3.5–5.1)
Sodium: 140 mEq/L (ref 135–145)
Total Bilirubin: 0.5 mg/dL (ref 0.3–1.2)
Total Protein: 6.9 g/dL (ref 6.0–8.3)

## 2011-12-03 LAB — CBC
HCT: 42.8 % (ref 36.0–46.0)
Hemoglobin: 14.1 g/dL (ref 12.0–15.0)
MCH: 28.4 pg (ref 26.0–34.0)
MCHC: 32.9 g/dL (ref 30.0–36.0)
MCV: 86.1 fL (ref 78.0–100.0)
Platelets: 215 10*3/uL (ref 150–400)
RBC: 4.97 MIL/uL (ref 3.87–5.11)
RDW: 14.5 % (ref 11.5–15.5)

## 2011-12-03 LAB — POCT I-STAT TROPONIN I: Troponin i, poc: 0 ng/mL (ref 0.00–0.08)

## 2011-12-03 LAB — CARDIAC PANEL(CRET KIN+CKTOT+MB+TROPI)
CK, MB: 2.3 ng/mL (ref 0.3–4.0)
Relative Index: 1.1 (ref 0.0–2.5)
Troponin I: <0.3 ng/mL (ref <0.30)

## 2011-12-03 LAB — TROPONIN I: Troponin I: <0.3 ng/mL (ref <0.30)

## 2011-12-03 MED ORDER — ASPIRIN 81 MG PO CHEW
324.0000 mg | CHEWABLE_TABLET | Freq: Once | ORAL | Status: AC
  Administered 2011-12-03: 324 mg via ORAL
  Filled 2011-12-03: qty 4

## 2011-12-03 MED ORDER — MORPHINE SULFATE 2 MG/ML IJ SOLN
2.0000 mg | Freq: Once | INTRAMUSCULAR | Status: AC
  Administered 2011-12-03: 2 mg via INTRAVENOUS
  Filled 2011-12-03 (×2): qty 1

## 2011-12-03 MED ORDER — ACETAMINOPHEN 500 MG PO TABS
1000.0000 mg | ORAL_TABLET | Freq: Once | ORAL | Status: AC
  Administered 2011-12-03: 1000 mg via ORAL
  Filled 2011-12-03: qty 2

## 2011-12-03 MED ORDER — NITROGLYCERIN 0.4 MG SL SUBL
0.4000 mg | SUBLINGUAL_TABLET | SUBLINGUAL | Status: DC | PRN
  Administered 2011-12-03 (×2): 0.4 mg via SUBLINGUAL
  Filled 2011-12-03: qty 25

## 2011-12-03 NOTE — ED Notes (Signed)
Complain of chest that started around 0400 this morning. States the pain is radiating to her jaw and shoulders

## 2011-12-03 NOTE — ED Provider Notes (Signed)
History   This chart was scribed for Benny Lennert, MD scribed by Magnus Sinning. The patient was seen in room APA10/APA10 seen at 8:45.    CSN: [id number]  Arrival date & time 12/03/11  4098   First MD Initiated Contact with Patient 12/03/11 0845      Chief Complaint  Patient presents with  . Chest Pain    (Consider location/radiation/quality/duration/timing/severity/associated sxs/prior Treatment) Karen White is a 56 y.o. female Patient is a 56 y.o. female presenting with chest pain. The history is provided by the patient. No language interpreter was used.  Chest Pain The chest pain began 3 - 5 hours ago. Chest pain occurs constantly. The chest pain is unchanged. The severity of the pain is moderate. The quality of the pain is described as pressure-like. The pain radiates to the right jaw, left jaw, left shoulder and right shoulder.  Her family medical history is significant for heart disease in family (Father had blockages in 36s. Mother experienced heart diseased in late 65s. Both deceased.).   Patient does not smoke and is a avid runner.  PCP: Prudy Feeler Past Medical History  Diagnosis Date  . Depression   . GERD (gastroesophageal reflux disease)   . Migraine     History reviewed. No pertinent past surgical history.  History reviewed. No pertinent family history.  History  Substance Use Topics  . Smoking status: Not on file  . Smokeless tobacco: Not on file  . Alcohol Use: Yes    Review of Systems  Respiratory: Positive for chest tightness.   Cardiovascular: Positive for chest pain.  Musculoskeletal: Positive for myalgias.  All other systems reviewed and are negative.   Allergies  Review of patient's allergies indicates no known allergies.  Home Medications   Current Outpatient Rx  Name Route Sig Dispense Refill  . DEXLANSOPRAZOLE 60 MG PO CPDR  Take one capsule by mouth daily 30 minutes before breakfast 30 capsule 6    BP 125/72  Pulse 77   Temp(Src) 97.8 F (36.6 C) (Oral)  Resp 18  Ht 5\' 2"  (1.575 m)  Wt 128 lb (58.06 kg)  BMI 23.41 kg/m2  SpO2 100%  Physical Exam  Nursing note and vitals reviewed. Constitutional: She is oriented to person, place, and time. She appears well-developed and well-nourished. No distress.  HENT:  Head: Normocephalic and atraumatic.  Eyes: Conjunctivae and EOM are normal. No scleral icterus.  Neck: Normal range of motion. Neck supple. No thyromegaly present.  Cardiovascular: Normal rate and regular rhythm.  Exam reveals no gallop and no friction rub.   No murmur heard. Pulmonary/Chest: Effort normal. No stridor. She has no wheezes. She has no rales. She exhibits no tenderness.  Abdominal: She exhibits no distension. There is no tenderness. There is no rebound.  Musculoskeletal: Normal range of motion. She exhibits no edema.  Lymphadenopathy:    She has no cervical adenopathy.  Neurological: She is alert and oriented to person, place, and time. Coordination normal.  Skin: Skin is warm and dry. No rash noted. No erythema.  Psychiatric: She has a normal mood and affect. Her behavior is normal.    ED Course  Procedures (including critical care time) DIAGNOSTIC STUDIES: Oxygen Saturation is 100% on room air, normal by my interpretation.    COORDINATION OF CARE: 10:19: EDMD performs recheck. Patient reports feeling better, but does note mild HA. States that chest pressure is relieved. 13:20: Physician performs recheck and notes patient is relieved of sx. He provides recommendations  for follow-up.  ED Medication Orders 8:52:NitroGLYCERIN SL tablet 0.4 mg  9:00:Aspirin chewable tablet 324 mg  Morphine 2 MG/ML injection 2 mg  10:30: Tylenol 1,000 mg  Labs Reviewed  COMPREHENSIVE METABOLIC PANEL - Abnormal; Notable for the following:    ALT 45 (*)    Alkaline Phosphatase 122 (*)    GFR calc non Af Amer 60 (*)    GFR calc Af Amer 70 (*)    All other components within normal limits    CARDIAC PANEL(CRET KIN+CKTOT+MB+TROPI) - Abnormal; Notable for the following:    Total CK 211 (*)    All other components within normal limits  CBC  POCT I-STAT TROPONIN I  TROPONIN I   Dg Chest 2 View  12/03/2011  *RADIOLOGY REPORT*  Clinical Data: Chest pain.  CHEST - 2 VIEW  Comparison: None  Findings: Heart and mediastinal contours are within normal limits. No focal opacities or effusions.  No acute bony abnormality.  IMPRESSION: No active disease.  Original Report Authenticated By: Cyndie Chime, M.D.    No diagnosis found.    Date: 12/03/2011  Rate: 78  Rhythm: normal sinus rhythm  QRS Axis: normal  Intervals: normal  ST/T Wave abnormalities: normal  Conduction Disutrbances:none  Narrative Interpretation:   Old EKG Reviewed: none available   MDM  Chest pain.  Two normal troponins pt chest pain free.  She is to follow up as out pt The chart was scribed for me under my direct supervision.  I personally performed the history, physical, and medical decision making and all procedures in the evaluation of this patient.Benny Lennert, MD 12/03/11 1332  Benny Lennert, MD 12/03/11 [phone]

## 2011-12-03 NOTE — Discharge Instructions (Signed)
Follow up with your md next week.  Take one aspirin a day.  And follow up with [facility] in 2-3 weeks

## 2011-12-14 ENCOUNTER — Encounter: Payer: Self-pay | Admitting: Cardiology

## 2011-12-14 ENCOUNTER — Ambulatory Visit (INDEPENDENT_AMBULATORY_CARE_PROVIDER_SITE_OTHER): Payer: BC Managed Care – PPO | Admitting: Cardiology

## 2011-12-14 VITALS — BP 157/84 | HR 82 | Resp 16 | Ht 62.0 in | Wt 126.0 lb

## 2011-12-14 DIAGNOSIS — R030 Elevated blood-pressure reading, without diagnosis of hypertension: Secondary | ICD-10-CM

## 2011-12-14 DIAGNOSIS — R079 Chest pain, unspecified: Secondary | ICD-10-CM

## 2011-12-14 DIAGNOSIS — K219 Gastro-esophageal reflux disease without esophagitis: Secondary | ICD-10-CM

## 2011-12-14 DIAGNOSIS — R011 Cardiac murmur, unspecified: Secondary | ICD-10-CM

## 2011-12-14 DIAGNOSIS — IMO0001 Reserved for inherently not codable concepts without codable children: Secondary | ICD-10-CM

## 2011-12-14 DIAGNOSIS — R072 Precordial pain: Secondary | ICD-10-CM

## 2011-12-14 NOTE — Assessment & Plan Note (Signed)
Known history. She reports that above symptoms were not typical for her reflux. Question esophageal spasm as possibility as well.

## 2011-12-14 NOTE — Patient Instructions (Signed)
**Note De-Identified Ikaika Showers Obfuscation** Your physician has requested that you have a stress echocardiogram. For further information please visit [email or web address] Please follow instruction sheet as given.  Your physician has requested that you have an echocardiogram. Echocardiography is a painless test that uses sound waves to create images of your heart. It provides your doctor with information about the size and shape of your heart and how well your heart's chambers and valves are working. This procedure takes approximately one hour. There are no restrictions for this procedure.  Your physician recommends that you continue on your current medications as directed. Please refer to the Current Medication list given to you today.  Your physician recommends that you schedule a follow-up appointment in: we will contact you with results of test

## 2011-12-14 NOTE — Progress Notes (Signed)
   Clinical Summary Karen White is a 56 y.o.female referred by Dr. Estell Harpin for cardiology consultation following recent ER visit with chest pain. She states that she awoke at 4 AM on Thursday  2/28 with chest "pressure" and neck discomfort. No reported palpitations or syncope, no dyspnea. She also had a headache. She ultimately went to the ER with persistent symptoms, lasting several hours. She states that NTG was helpful.  Workup in the ER revealed normal troponin I, ECG with small R primes V1-V2, overall normal. CXR reported no acute findings. Total CK was increased although MB normal at 2.3. ALT mildly increased at 45, alkaline phosphatase 122.  She states that she runs 3 days a week, usually around 3 miles at a time. Reports no exertional chest pain or unusual shortness of breath. She has had no recurrent chest pain since original event. Feels somewhat "tired." She has not undergone any previous ischemic testing.  She does have reflux symptoms, states that the above episode was different in general.  No Known Allergies  Current Outpatient Prescriptions  Medication Sig Dispense Refill  . ALPRAZolam (XANAX) 0.5 MG tablet Take 0.5 mg by mouth at bedtime as needed. Sleep      . aspirin 81 MG tablet Take 81 mg by mouth daily.      Marland Kitchen dexlansoprazole (DEXILANT) 60 MG capsule Take one capsule by mouth daily 30 minutes before breakfast  30 capsule  6  . escitalopram (LEXAPRO) 10 MG tablet Take 10 mg by mouth at bedtime.       Marland Kitchen ibuprofen (ADVIL,MOTRIN) 200 MG tablet Take 600 mg by mouth every 6 (six) hours as needed. Pain      . rizatriptan (MAXALT-MLT) 10 MG disintegrating tablet Take 10 mg by mouth as needed. Migraine. May repeat in 2 hours if needed      . topiramate (TOPAMAX) 100 MG tablet Take 100 mg by mouth at bedtime.        Past Medical History  Diagnosis Date  . Depression   . GERD (gastroesophageal reflux disease)   . Migraine     Past Surgical History  Procedure Date  . Tongue  biopsy   . Schwanoma tumor resection     Family History  Problem Relation Age of Onset  . Coronary artery disease Father     Age 44s  . Coronary artery disease Mother     Age 66s  . Cancer Mother     Social History Ms. Sheu reports that she has never smoked. She has never used smokeless tobacco. Ms. Ergle reports that she drinks alcohol.  Review of Systems No sough, fevers, appetite changes, melena or hematochezia. Has lost approximately 20 pounds - intentional. Otherwise negative.  Physical Examination Filed Vitals:   12/14/11 1422  BP: 157/84  Pulse: 82  Resp: 16   Normally nourished appearing woman in NAD. HEENT: Conjunctiva and lids normal, oropharynx clear with moist mucosa. Neck: Supple, no elevated JVP or carotid bruits, no thyromegaly. Lungs: Clear to auscultation, nonlabored breathing at rest. Cardiac: Regular rate and rhythm, no S3, 2/6 systolic murmur, no pericardial rub. Abdomen: Soft, nontender, bowel sounds present, no guarding or rebound. Extremities: No pitting edema, distal pulses 2+. Skin: Warm and dry. Musculoskeletal: No kyphosis. Neuropsychiatric: Alert and oriented x3, affect grossly appropriate.    Problem List and Plan

## 2011-12-14 NOTE — Assessment & Plan Note (Signed)
Noted today without standing history of hypertension. Should keep an eye on this with Ms. Karen White at primary care followup.

## 2011-12-14 NOTE — Assessment & Plan Note (Signed)
Transthoracic echocardiogram arranged to further assess with no prior studies. She states that she has a sister with a heart murmur, no clear family history of valvular disease.

## 2011-12-14 NOTE — Assessment & Plan Note (Signed)
Outlined above with initial workup in ER overall reassuring from cardiac perspective. Description still concerning although her baseline risk profile is relatively low (no known DM2, hyperlipidemia, HTN, no definite premature CAD in family). Plan is to evaluate her further with exercise echocardiogram for objective assessment of ischemia. Will inform her of the results.

## 2011-12-18 ENCOUNTER — Ambulatory Visit (HOSPITAL_COMMUNITY)
Admission: RE | Admit: 2011-12-18 | Discharge: 2011-12-18 | Disposition: A | Discharge status: Home / Self Care | Payer: BC Managed Care – PPO | Source: Ambulatory Visit | Attending: Cardiology | Admitting: Cardiology

## 2011-12-18 DIAGNOSIS — R072 Precordial pain: Secondary | ICD-10-CM

## 2011-12-18 DIAGNOSIS — I517 Cardiomegaly: Secondary | ICD-10-CM

## 2011-12-18 DIAGNOSIS — R011 Cardiac murmur, unspecified: Secondary | ICD-10-CM

## 2011-12-18 DIAGNOSIS — R079 Chest pain, unspecified: Secondary | ICD-10-CM | POA: Insufficient documentation

## 2011-12-18 NOTE — Progress Notes (Signed)
*  PRELIMINARY RESULTS* Echocardiogram Echocardiogram Stress Test has been performed.  Karen White 12/18/2011, 10:21 AM

## 2011-12-18 NOTE — Progress Notes (Signed)
Stress Lab Nurses Notes - [facility]  NELEH MULDOON 12/18/2011  Reason for doing test: Chest Pain Type of test: Stress Echo Nurse performing test: Parke Poisson, RN Nuclear Medicine Tech: Not Applicable Echo Tech: Karrie Doffing MD performing test: Ival Bible & Joni Reining NP Family MD: Zammit Test explained and consent signed: yes IV started: No IV started Symptoms: fatigue Treatment/Intervention: None Reason test stopped: fatigue and reached target HR After recovery IV was: NA Patient to return to Nuc. Med at : NA Patient discharged: Home Patient's Condition upon discharge was: stable Comments: During test peak BP 152/58 & HR 163.  Recovery BP 110/60 & HR 84.  Symptoms resolved in recovery. Erskine Speed T

## 2011-12-18 NOTE — Progress Notes (Signed)
*  PRELIMINARY RESULTS* Echocardiogram 2D Echocardiogram has been performed.  Conrad Sharon 12/18/2011, 9:01 AM

## 2012-01-06 ENCOUNTER — Encounter (INDEPENDENT_AMBULATORY_CARE_PROVIDER_SITE_OTHER): Payer: Self-pay | Admitting: Surgery

## 2012-01-06 ENCOUNTER — Ambulatory Visit (INDEPENDENT_AMBULATORY_CARE_PROVIDER_SITE_OTHER): Payer: BC Managed Care – PPO | Admitting: Surgery

## 2012-01-06 VITALS — BP 125/69 | HR 73 | Temp 98.2°F | Ht 62.0 in | Wt 126.6 lb

## 2012-01-06 DIAGNOSIS — D171 Benign lipomatous neoplasm of skin and subcutaneous tissue of trunk: Secondary | ICD-10-CM | POA: Insufficient documentation

## 2012-01-06 DIAGNOSIS — D1779 Benign lipomatous neoplasm of other sites: Secondary | ICD-10-CM

## 2012-01-06 NOTE — Patient Instructions (Signed)
Return if this lipoma gets larger

## 2012-01-06 NOTE — Progress Notes (Signed)
Chief Complaint:  Mass to the left of midline on the lower back  History of Present Illness:  Karen White is an 56 y.o. female has recently begun running 5 k and losing weight .  She has noticed a soft mass that she has had for several years that she is more aware of with her reduced weight.    Past Medical History  Diagnosis Date  . Depression   . GERD (gastroesophageal reflux disease)   . Migraine     Past Surgical History  Procedure Date  . Tongue biopsy   . Schwanoma tumor resection   . Mercury posion from fillings     Current Outpatient Prescriptions  Medication Sig Dispense Refill  . ALPRAZolam (XANAX) 0.5 MG tablet Take 0.5 mg by mouth at bedtime as needed. Sleep      . aspirin 81 MG tablet Take 81 mg by mouth daily.      Marland Kitchen dexlansoprazole (DEXILANT) 60 MG capsule Take one capsule by mouth daily 30 minutes before breakfast  30 capsule  6  . escitalopram (LEXAPRO) 10 MG tablet Take 10 mg by mouth at bedtime.       . rizatriptan (MAXALT-MLT) 10 MG disintegrating tablet Take 10 mg by mouth as needed. Migraine. May repeat in 2 hours if needed      . topiramate (TOPAMAX) 100 MG tablet Take 100 mg by mouth at bedtime.       Review of patient's allergies indicates no known allergies. Family History  Problem Relation Age of Onset  . Coronary artery disease Father     Age 59s  . Heart disease Father   . Coronary artery disease Mother     Age 60s  . Cancer Mother   . Cancer Maternal Grandmother     ovarian   Social History:   reports that she quit smoking about 36 years ago. She has never used smokeless tobacco. She reports that she drinks alcohol. She reports that she does not use illicit drugs.   REVIEW OF SYSTEMS - PERTINENT POSITIVES ONLY: Non contributory  Physical Exam:   Blood pressure 125/69, pulse 73, temperature 98.2 F (36.8 C), temperature source Temporal, height 5\' 2"  (1.575 m), weight 126 lb 9.6 oz (57.425 kg), SpO2 99.00%. Body mass index is 23.16  kg/(m^2).  Gen:  WDWN white female NAD  Neurological: Alert and oriented to person, place, and time. Motor and sensory function is grossly intact  Head: Normocephalic and atraumatic.  Eyes: Conjunctivae are normal. Pupils are equal, round, and reactive to light. No scleral icterus.  Neck: Normal range of motion. Neck supple. No tracheal deviation or thyromegaly present.  Cardiovascular:  SR without murmurs or gallops.  No carotid bruits Respiratory: Effort normal.  No respiratory distress. No chest wall tenderness. Breath sounds normal.  No wheezes, rales or rhonchi.  Abdomen:  neg GU: Back: About 1 fb to the left of midline around L1 there is a soft mass that is freely moveable, about2 cm in diameter and is consistent with a lipoma Musculoskeletal: Normal range of motion. Extremities are nontender. No cyanosis, edema or clubbing noted Lymphadenopathy: No cervical, preauricular, postauricular or axillary adenopathy is present Skin: Skin is warm and dry. No rash noted. No diaphoresis. No erythema. No pallor. Pscyh: Normal mood and affect. Behavior is normal. Judgment and thought content normal.   LABORATORY RESULTS: No results found for this or any previous visit (from the past 48 hour(s)).  RADIOLOGY RESULTS: No results found.  Problem List:  Patient Active Problem List  Diagnoses  . ESOPHAGEAL STRICTURE  . ESOPHAGEAL REFLUX  . DYSPHAGIA UNSPECIFIED  . DIARRHEA  . Cervicalgia  . Precordial pain  . Elevated blood pressure  . 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  We discussed management and she would like to observe this and I would concur.     Matt B. Daphine Deutscher, MD, [facility] [phone] beeper [phone]  01/06/2012 4:40 PM

## 2012-04-11 ENCOUNTER — Encounter (HOSPITAL_COMMUNITY): Payer: Self-pay

## 2012-06-02 IMAGING — CR DG CHEST 2V
2 series · 2 of 2 positions shown · non-contrast
Comparison: None

CLINICAL DATA: Chest pain.

CHEST - 2 VIEW

[view not recorded (1 of 2)]
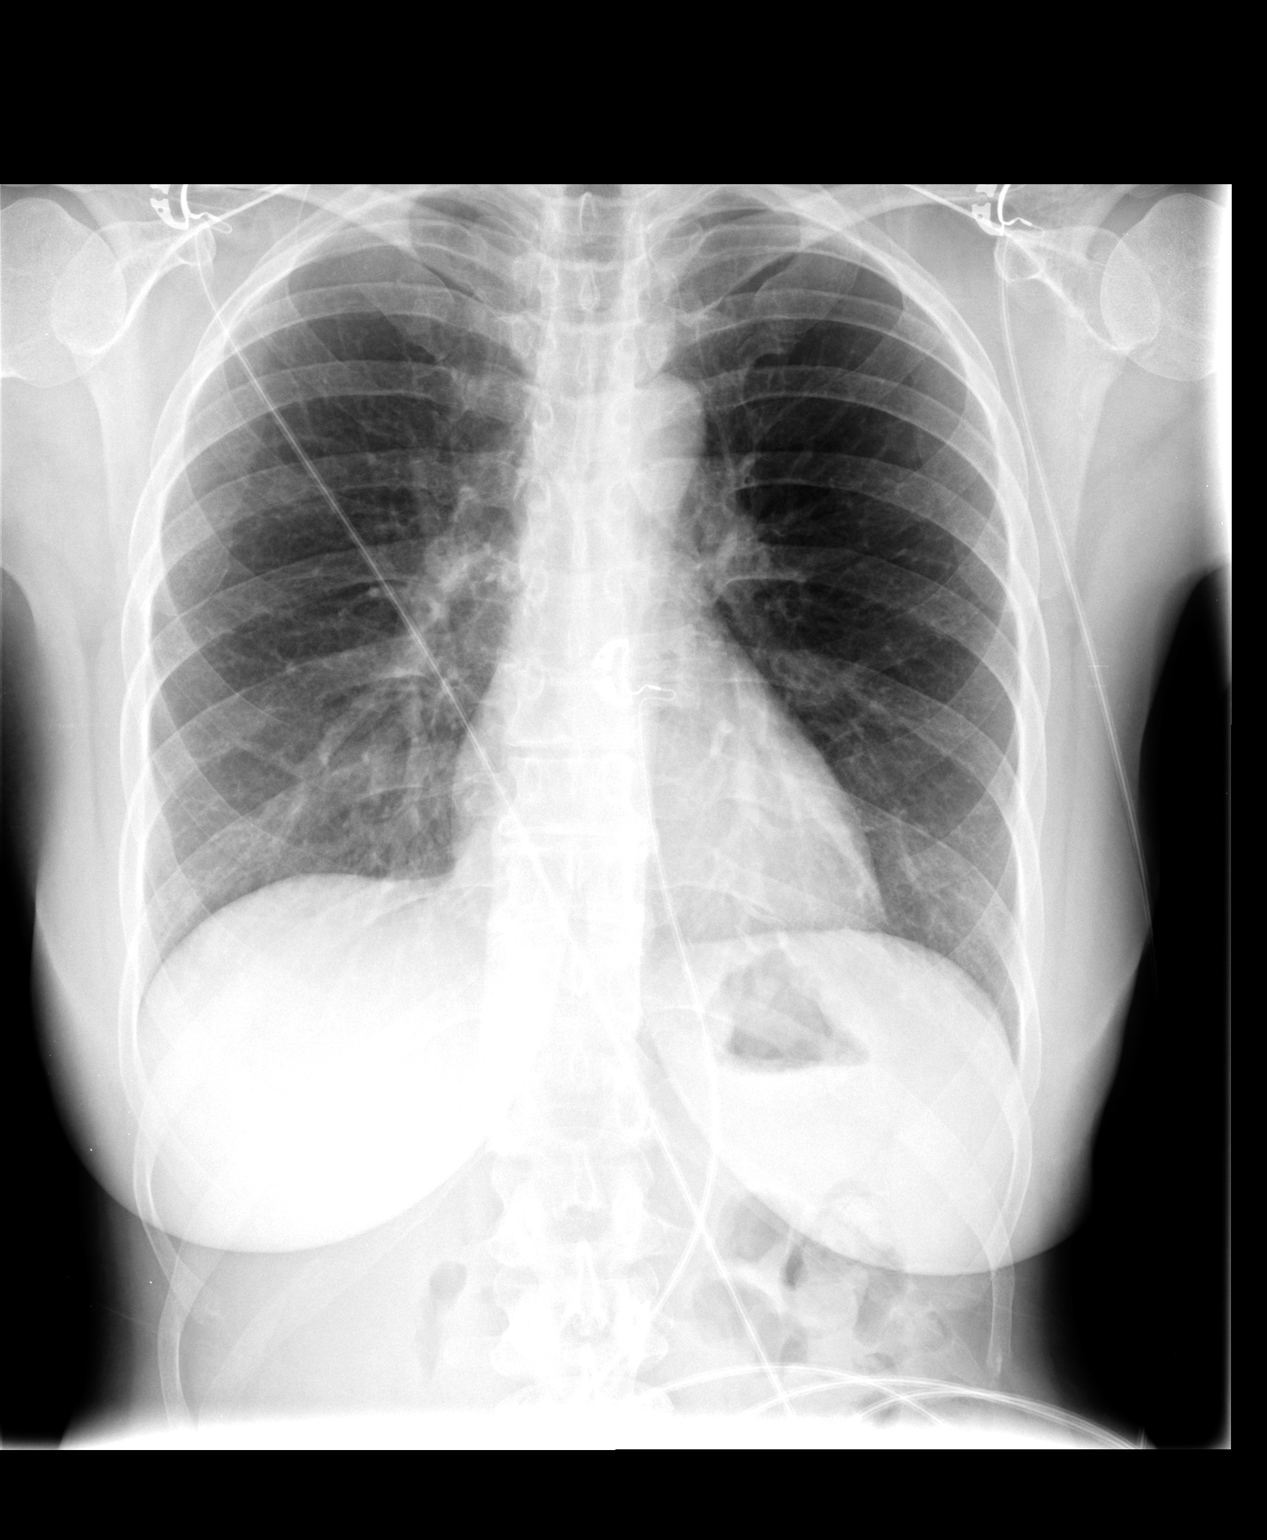

[view not recorded (2 of 2)]
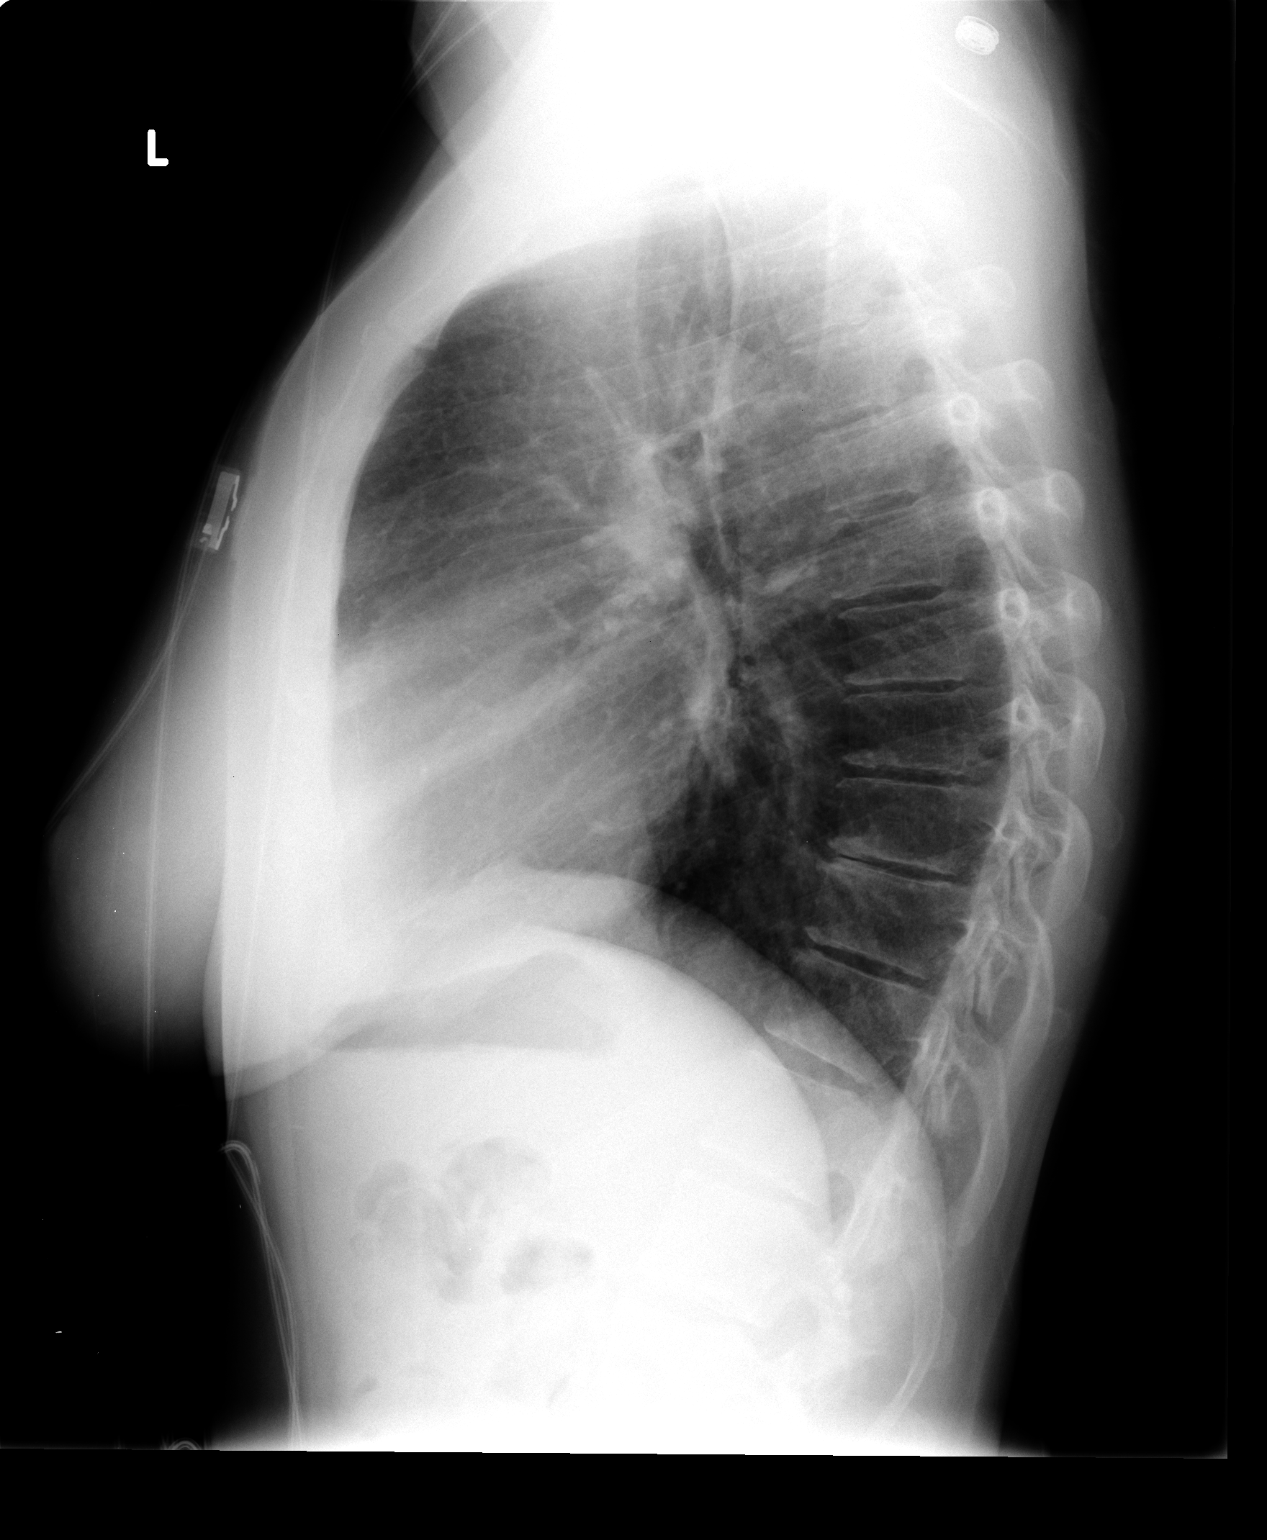

[2 of 2 positions shown; findings below may reference images not displayed]

FINDINGS: Heart and mediastinal contours are within normal limits.
No focal opacities or effusions.  No acute bony abnormality.
IMPRESSION: No active disease.

## 2012-12-16 ENCOUNTER — Other Ambulatory Visit: Payer: Self-pay | Admitting: Otolaryngology

## 2014-03-21 ENCOUNTER — Other Ambulatory Visit: Payer: Self-pay | Admitting: Physician Assistant

## 2014-06-06 ENCOUNTER — Encounter: Payer: Self-pay | Admitting: Gastroenterology

## 2014-08-01 ENCOUNTER — Other Ambulatory Visit: Payer: Self-pay | Admitting: Obstetrics and Gynecology

## 2014-08-03 LAB — CYTOLOGY - PAP

## 2014-08-15 ENCOUNTER — Other Ambulatory Visit: Payer: Self-pay | Admitting: Obstetrics and Gynecology

## 2014-08-16 LAB — CYTOLOGY - PAP

## 2015-09-05 ENCOUNTER — Encounter (HOSPITAL_COMMUNITY): Payer: BC Managed Care – PPO

## 2015-11-11 ENCOUNTER — Encounter: Payer: Self-pay | Admitting: Gastroenterology

## 2015-11-26 ENCOUNTER — Ambulatory Visit (AMBULATORY_SURGERY_CENTER): Payer: Self-pay | Admitting: *Deleted

## 2015-11-26 VITALS — Ht 61.0 in | Wt 123.6 lb

## 2015-11-26 DIAGNOSIS — Z1211 Encounter for screening for malignant neoplasm of colon: Secondary | ICD-10-CM

## 2015-11-26 MED ORDER — SUPREP BOWEL PREP KIT 17.5-3.13-1.6 GM/177ML PO SOLN: 1.0000 | Freq: Once | ORAL | Status: DC

## 2015-11-26 NOTE — Progress Notes (Signed)
Patient denies any allergies to egg or soy products. Patient denies complications with anesthesia/sedation.  Patient denies oxygen use at home and denies diet medications. Emmi instructions for colonoscopy explained and given to patient.  

## 2015-12-10 ENCOUNTER — Encounter: Payer: Self-pay | Admitting: Gastroenterology

## 2015-12-10 ENCOUNTER — Ambulatory Visit (AMBULATORY_SURGERY_CENTER): Payer: BC Managed Care – PPO | Admitting: Gastroenterology

## 2015-12-10 VITALS — BP 105/61 | HR 63 | Temp 96.6°F | Resp 18 | Ht 61.0 in | Wt 123.0 lb

## 2015-12-10 DIAGNOSIS — Z1211 Encounter for screening for malignant neoplasm of colon: Secondary | ICD-10-CM

## 2015-12-10 MED ORDER — SODIUM CHLORIDE 0.9 % IV SOLN
500.0000 mL | INTRAVENOUS | Status: DC
Start: 2015-12-10 — End: 2015-12-10

## 2015-12-10 NOTE — Patient Instructions (Signed)

## 2015-12-10 NOTE — Progress Notes (Signed)
Patient awakening,vss,report to rn 

## 2015-12-10 NOTE — Op Note (Signed)
[place]  [address]. [place], [address]   COLONOSCOPY PROCEDURE REPORT  PATIENT: Karen White, Karen White  MR#: [id number] BIRTHDATE: [birth year] , 82  yrs. old GENDER: female ENDOSCOPIST: Yetta Flock, MD REFERRED BY: PROCEDURE DATE:  12/10/2015 PROCEDURE:   Colonoscopy, screening First Screening Colonoscopy - Avg.  risk and is 50 yrs.  old or older Yes.  Prior Negative Screening - Now for repeat screening. N/A  History of Adenoma - Now for follow-up colonoscopy & has been > or = to 3 yrs.  N/A  Polyps removed today? No Recommend repeat exam, <10 yrs? No ASA CLASS:   Class II INDICATIONS:Screening for colonic neoplasia and Colorectal Neoplasm Risk Assessment for this procedure is average risk. MEDICATIONS: Propofol 300 mg IV  DESCRIPTION OF PROCEDURE:   After the risks benefits and alternatives of the procedure were thoroughly explained, informed consent was obtained.  The digital rectal exam revealed no abnormalities of the rectum.   The LB [id number]  endoscope was introduced through the anus and advanced to the cecum, which was identified by both the appendix and ileocecal valve. No adverse events experienced.   The quality of the prep was adequate  The instrument was then slowly withdrawn as the colon was fully examined. Estimated blood loss is zero unless otherwise noted in this procedure report.    COLON FINDINGS: Mild diverticulosis was noted in the left colon, with some restricted mobility in the sigmoid colon due to diverticulosis.  The remainder of the examined colon was otherwise normal.  No polyps or mass lesions noted.  Hypertrophied anal papillae were noted on retroflexion.     The time to cecum = 5.2 Withdrawal time = 9.1   The scope was withdrawn and the procedure completed. COMPLICATIONS: There were no immediate complications.  ENDOSCOPIC IMPRESSION: Mild diverticulosis of the left colon No polyps Hypertrophied anal  papillae  RECOMMENDATIONS: Resume diet Resume medications Repeat colon cancer screening in 10 years  eSigned:  Yetta Flock, MD 12/10/2015 10:06 AM   cc:  the patient

## 2015-12-11 ENCOUNTER — Telehealth: Payer: Self-pay | Admitting: Emergency Medicine

## 2015-12-11 NOTE — Telephone Encounter (Signed)
  Follow up Call-  Call back number 12/10/2015  Post procedure Call Back phone  # [phone]  Permission to leave phone message Yes     Patient questions:  Do you have a fever, pain , or abdominal swelling? No. Pain Score  0 *  Have you tolerated food without any problems? Yes.    Have you been able to return to your normal activities? Yes.    Do you have any questions about your discharge instructions: Diet   No. Medications  No. Follow up visit  No.  Do you have questions or concerns about your Care? No.  Actions: * If pain score is 4 or above: No action needed, pain <4.

## 2016-06-03 ENCOUNTER — Other Ambulatory Visit: Payer: Self-pay | Admitting: Physician Assistant

## 2016-06-04 NOTE — Telephone Encounter (Signed)
Pt requesting refill on Topamax, ok to refill?

## 2016-08-14 ENCOUNTER — Other Ambulatory Visit: Payer: Self-pay | Admitting: Physician Assistant

## 2016-09-11 ENCOUNTER — Other Ambulatory Visit: Payer: Self-pay | Admitting: Physician Assistant

## 2016-09-11 NOTE — Telephone Encounter (Signed)
Pt requesting refill on Maxalt No OV at [facility]

## 2016-09-18 ENCOUNTER — Ambulatory Visit (INDEPENDENT_AMBULATORY_CARE_PROVIDER_SITE_OTHER): Payer: BC Managed Care – PPO | Admitting: Physician Assistant

## 2016-09-18 ENCOUNTER — Encounter: Payer: Self-pay | Admitting: Physician Assistant

## 2016-09-18 VITALS — BP 129/79 | HR 67 | Temp 96.6°F | Ht 61.0 in | Wt 132.0 lb

## 2016-09-18 DIAGNOSIS — J209 Acute bronchitis, unspecified: Secondary | ICD-10-CM | POA: Diagnosis not present

## 2016-09-18 DIAGNOSIS — Z780 Asymptomatic menopausal state: Secondary | ICD-10-CM

## 2016-09-18 MED ORDER — PREDNISONE 10 MG (21) PO TBPK: ORAL_TABLET | ORAL | 0 refills | Status: DC

## 2016-09-18 MED ORDER — AZITHROMYCIN 250 MG PO TABS: ORAL_TABLET | ORAL | 0 refills | Status: DC

## 2016-09-18 NOTE — Patient Instructions (Signed)

## 2016-09-21 DIAGNOSIS — J209 Acute bronchitis, unspecified: Secondary | ICD-10-CM | POA: Insufficient documentation

## 2016-09-21 DIAGNOSIS — Z780 Asymptomatic menopausal state: Secondary | ICD-10-CM | POA: Insufficient documentation

## 2016-09-21 NOTE — Progress Notes (Signed)
BP 129/79   Pulse 67   Temp (!) 96.6 F (35.9 C) (Oral)   Ht 5\' 1"  (1.549 m)   Wt 132 lb (59.9 kg)   BMI 24.94 kg/m    Subjective:    Patient ID: Karen White, female    DOB: 1956/09/21, 60 y.o.   MRN: [id number]  HPI: Karen White is a 60 y.o. female presenting on 09/18/2016 for Cough and Nasal Congestion Patient has had greater than one week cough and congestion. She has had history of bronchitis in the past. She has had some copious drainage. Her cough is very bad at night. She denies any severe wheezing at this time. She denies any nausea vomiting or diarrhea.  Patient here to be established as new patient at [place].  This patient is known to me from [facility]. This patient comes in for periodic recheck on medications and conditions. All medications are reviewed today. There are no reports of any problems with the medications. All of the medical conditions are reviewed and updated.  Lab work is reviewed and will be ordered as medically necessary.    Past Medical History:  Diagnosis Date  . Allergy   . Anxiety   . Depression   . GERD (gastroesophageal reflux disease)   . Migraine    Relevant past medical, surgical, family and social history reviewed and updated as indicated. Interim medical history since our last visit reviewed. Allergies and medications reviewed and updated. DATA REVIEWED: CHART IN EPIC  Social History   Social History  . Marital status: Divorced    Spouse name: N/A  . Number of children: N/A  . Years of education: N/A   Occupational History  . Not on file.   Social History Main Topics  . Smoking status: Former Smoker    Packs/day: 0.50    Types: Cigarettes    Quit date: 01/06/1976  . Smokeless tobacco: Never Used  . Alcohol use 1.2 oz/week    2 Glasses of wine per week     Comment: One drink a week  . Drug use: No  . Sexual activity: Yes    Birth control/ protection: Post-menopausal   Other  Topics Concern  . Not on file   Social History Narrative  . No narrative on file    Past Surgical History:  Procedure Laterality Date  . mercury posion from fillings    . Schwanoma tumor resection    . TONGUE BIOPSY      Family History  Problem Relation Age of Onset  . Coronary artery disease Father     Age 81s  . Heart disease Father   . Coronary artery disease Mother     Age 38s  . Cancer Mother   . Cancer Maternal Grandmother     ovarian  . Colon cancer Neg Hx   . Colon polyps Neg Hx   . Esophageal cancer Neg Hx   . Rectal cancer Neg Hx   . Stomach cancer Neg Hx     Review of Systems  Constitutional: Positive for chills and fatigue. Negative for activity change and appetite change.  HENT: Positive for congestion, postnasal drip and sore throat.   Eyes: Negative.   Respiratory: Positive for cough and wheezing.   Cardiovascular: Negative.  Negative for chest pain, palpitations and leg swelling.  Gastrointestinal: Negative.   Genitourinary: Negative.   Musculoskeletal: Negative.   Skin: Negative.   Neurological: Positive for headaches.    Allergies  as of 09/18/2016   No Known Allergies     Medication List       Accurate as of 09/18/16 11:59 PM. Always use your most recent med list.          ALPRAZolam 0.5 MG tablet Commonly known as:  XANAX Take 0.5 mg by mouth at bedtime as needed. Sleep   aspirin 81 MG tablet Take 81 mg by mouth daily. Reported on 11/26/2015   azithromycin 250 MG tablet Commonly known as:  ZITHROMAX Take as directed   cholecalciferol 1000 units tablet Commonly known as:  VITAMIN D Place 2,000 Units under the tongue daily.   escitalopram 20 MG tablet Commonly known as:  LEXAPRO TAKE ONE (1) TABLET EACH DAY   estradiol 0.05 MG/24HR patch Commonly known as:  VIVELLE-DOT Place 1 patch onto the skin 2 (two) times a week.   GNP CALCIUM 1200 PO Take by mouth.   predniSONE 10 MG (21) Tbpk tablet Commonly known as:  STERAPRED  UNI-PAK 21 TAB As directed x 6 days   progesterone 100 MG capsule Commonly known as:  PROMETRIUM   rizatriptan 10 MG disintegrating tablet Commonly known as:  MAXALT-MLT PLACE 1 TABLET ON TONGUE AND ALLOW TO DISSOLVE AS NEEDED FOR ONSET OFHEADACHE   topiramate 100 MG tablet Commonly known as:  TOPAMAX Take 100 mg by mouth at bedtime.          Objective:    BP 129/79   Pulse 67   Temp (!) 96.6 F (35.9 C) (Oral)   Ht 5\' 1"  (1.549 m)   Wt 132 lb (59.9 kg)   BMI 24.94 kg/m   No Known Allergies  Wt Readings from Last 3 Encounters:  09/18/16 132 lb (59.9 kg)  12/10/15 123 lb (55.8 kg)  11/26/15 123 lb 9.6 oz (56.1 kg)    Physical Exam  Constitutional: She is oriented to person, place, and time. She appears well-developed and well-nourished.  HENT:  Head: Normocephalic and atraumatic.  Right Ear: A middle ear effusion is present.  Left Ear: A middle ear effusion is present.  Nose: Mucosal edema present. Right sinus exhibits no frontal sinus tenderness. Left sinus exhibits no frontal sinus tenderness.  Mouth/Throat: Posterior oropharyngeal erythema present. No oropharyngeal exudate or tonsillar abscesses.  Eyes: Conjunctivae and EOM are normal. Pupils are equal, round, and reactive to light.  Neck: Normal range of motion.  Cardiovascular: Normal rate, regular rhythm, normal heart sounds and intact distal pulses.   Pulmonary/Chest: Effort normal and breath sounds normal.  Abdominal: Soft. Bowel sounds are normal.  Neurological: She is alert and oriented to person, place, and time. She has normal reflexes.  Skin: Skin is warm and dry. No rash noted.  Psychiatric: She has a normal mood and affect. Her behavior is normal. Judgment and thought content normal.  Nursing note and vitals reviewed.     Assessment & Plan:   1. Acute bronchitis, unspecified organism - azithromycin (ZITHROMAX) 250 MG tablet; Take as directed  Dispense: 6 tablet; Refill: 0 - predniSONE (STERAPRED  UNI-PAK 21 TAB) 10 MG (21) TBPK tablet; As directed x 6 days  Dispense: 21 tablet; Refill: 0  2. Menopause - estradiol (VIVELLE-DOT) 0.05 MG/24HR patch; Place 1 patch onto the skin 2 (two) times a week. - progesterone (PROMETRIUM) 100 MG capsule;    Continue all other maintenance medications as listed above.  Follow up plan: Return if symptoms worsen or fail to improve.  Educational handout given for bronchitis  Terald Sleeper  PA-C [place]  [place], [place] [address] [phone]   09/21/2016, 10:02 PM

## 2016-10-10 ENCOUNTER — Other Ambulatory Visit: Payer: Self-pay | Admitting: Physician Assistant

## 2016-12-17 ENCOUNTER — Other Ambulatory Visit: Payer: Self-pay | Admitting: Physician Assistant

## 2017-01-25 ENCOUNTER — Other Ambulatory Visit: Payer: Self-pay | Admitting: Physician Assistant

## 2017-03-29 ENCOUNTER — Other Ambulatory Visit: Payer: Self-pay | Admitting: Physician Assistant

## 2017-03-29 NOTE — Telephone Encounter (Signed)
Last seen 09/18/16  Glenard Haring  If approved route to nurse to call into  [facility]

## 2017-03-31 NOTE — Telephone Encounter (Signed)
Rx called in 

## 2017-06-15 ENCOUNTER — Other Ambulatory Visit: Payer: Self-pay | Admitting: Physician Assistant

## 2017-06-16 NOTE — Telephone Encounter (Signed)
Rx phoned in.   

## 2017-09-15 ENCOUNTER — Telehealth: Payer: Self-pay | Admitting: Physician Assistant

## 2017-09-15 NOTE — Telephone Encounter (Signed)
Made apt for the 14th with Jones.

## 2017-09-17 ENCOUNTER — Encounter: Payer: Self-pay | Admitting: Physician Assistant

## 2017-09-17 ENCOUNTER — Other Ambulatory Visit: Payer: Self-pay | Admitting: Physician Assistant

## 2017-09-17 ENCOUNTER — Ambulatory Visit: Payer: BC Managed Care – PPO | Admitting: Physician Assistant

## 2017-09-17 VITALS — BP 136/82 | HR 67 | Temp 97.2°F | Ht 61.0 in | Wt 124.0 lb

## 2017-09-17 DIAGNOSIS — Z0000 Encounter for general adult medical examination without abnormal findings: Secondary | ICD-10-CM

## 2017-09-17 DIAGNOSIS — G43809 Other migraine, not intractable, without status migrainosus: Secondary | ICD-10-CM | POA: Diagnosis not present

## 2017-09-17 MED ORDER — RIZATRIPTAN BENZOATE 10 MG PO TBDP: ORAL_TABLET | ORAL | 5 refills | Status: DC

## 2017-09-17 MED ORDER — TOPIRAMATE 50 MG PO TABS: 150.0000 mg | ORAL_TABLET | Freq: Every day | ORAL | 3 refills | Status: DC

## 2017-09-17 NOTE — Patient Instructions (Signed)
In a few days you may receive a survey in the mail or online from [employer] regarding your visit with us today. Please take a moment to fill this out. Your feedback is very important to our whole office. It can help us better understand your needs as well as improve your experience and satisfaction. Thank you for taking your time to complete it. We care about you.  Kristoph Sattler, PA-C  

## 2017-09-18 LAB — CBC WITH DIFFERENTIAL/PLATELET
BASOS ABS: 0 10*3/uL (ref 0.0–0.2)
BASOS: 0 %
EOS (ABSOLUTE): 0.1 10*3/uL (ref 0.0–0.4)
Eos: 2 %
HEMOGLOBIN: 14.2 g/dL (ref 11.1–15.9)
Hematocrit: 43.8 % (ref 34.0–46.6)
Immature Grans (Abs): 0 10*3/uL (ref 0.0–0.1)
Immature Granulocytes: 0 %
LYMPHS ABS: 2.4 10*3/uL (ref 0.7–3.1)
LYMPHS: 46 %
MCH: 29.2 pg (ref 26.6–33.0)
MCHC: 32.4 g/dL (ref 31.5–35.7)
MCV: 90 fL (ref 79–97)
Monocytes Absolute: 0.5 10*3/uL (ref 0.1–0.9)
Monocytes: 9 %
NEUTROS ABS: 2.3 10*3/uL (ref 1.4–7.0)
Neutrophils: 43 %
Platelets: 225 10*3/uL (ref 150–379)
RBC: 4.86 x10E6/uL (ref 3.77–5.28)
RDW: 14.1 % (ref 12.3–15.4)
WBC: 5.4 10*3/uL (ref 3.4–10.8)

## 2017-09-18 LAB — CMP14+EGFR
A/G RATIO: 2.1 (ref 1.2–2.2)
ALK PHOS: 83 IU/L (ref 39–117)
ALT: 13 IU/L (ref 0–32)
AST: 17 IU/L (ref 0–40)
Albumin: 4.6 g/dL (ref 3.6–4.8)
BILIRUBIN TOTAL: 0.5 mg/dL (ref 0.0–1.2)
BUN/Creatinine Ratio: 12 (ref 12–28)
BUN: 11 mg/dL (ref 8–27)
CHLORIDE: 106 mmol/L (ref 96–106)
CO2: 24 mmol/L (ref 20–29)
Calcium: 9.6 mg/dL (ref 8.7–10.3)
Creatinine, Ser: 0.91 mg/dL (ref 0.57–1.00)
GFR calc non Af Amer: 69 mL/min/{1.73_m2} (ref >59)
GFR, EST AFRICAN AMERICAN: 79 mL/min/{1.73_m2} (ref >59)
GLUCOSE: 96 mg/dL (ref 65–99)
Globulin, Total: 2.2 g/dL (ref 1.5–4.5)
POTASSIUM: 4.5 mmol/L (ref 3.5–5.2)
SODIUM: 143 mmol/L (ref 134–144)
Total Protein: 6.8 g/dL (ref 6.0–8.5)

## 2017-09-18 LAB — LIPID PANEL
CHOLESTEROL TOTAL: 184 mg/dL (ref 100–199)
Chol/HDL Ratio: 2.9 ratio (ref 0.0–4.4)
HDL: 64 mg/dL (ref >39)
LDL Calculated: 108 mg/dL — ABNORMAL HIGH (ref 0–99)
Triglycerides: 61 mg/dL (ref 0–149)
VLDL CHOLESTEROL CAL: 12 mg/dL (ref 5–40)

## 2017-09-18 LAB — TSH: TSH: 2.26 u[IU]/mL (ref 0.450–4.500)

## 2017-09-20 NOTE — Telephone Encounter (Signed)
Last seen 09/17/17  Glenard Haring If approved route to nurse to call into [facility]

## 2017-09-20 NOTE — Telephone Encounter (Signed)
rx called into pharmacy

## 2017-09-20 NOTE — Progress Notes (Signed)
BP 136/82   Pulse 67   Temp (!) 97.2 F (36.2 C) (Oral)   Ht 5' 1"  (1.549 m)   Wt 124 lb (56.2 kg)   BMI 23.43 kg/m    Subjective:    Patient ID: [name], female    DOB: 28-Oct-1955, 61 y.o.   MRN: [id number]  HPI: Karen White is a 61 y.o. female presenting on 09/17/2017 for Migraine  Headaches have been worse. They are always on the left temple area and have associated nausea.  She has been on topamax 100 mg for some time  And had been very good in prevention. This past week had one for 3 days and the maxalt would not treat.  We will make adjustment in her meds to see if we can get these reduced.   Also need labs performed  Relevant past medical, surgical, family and social history reviewed and updated as indicated. Allergies and medications reviewed and updated.  Past Medical History:  Diagnosis Date  . Allergy   . Anxiety   . Depression   . GERD (gastroesophageal reflux disease)   . Migraine     Past Surgical History:  Procedure Laterality Date  . mercury posion from fillings    . Schwanoma tumor resection    . TONGUE BIOPSY      Review of Systems  Constitutional: Negative.  Negative for activity change, fatigue and fever.  HENT: Negative.   Eyes: Negative.   Respiratory: Negative.  Negative for cough.   Cardiovascular: Negative.  Negative for chest pain.  Gastrointestinal: Positive for nausea. Negative for abdominal pain.  Endocrine: Negative.   Genitourinary: Negative.  Negative for dysuria.  Musculoskeletal: Negative.   Skin: Negative.   Neurological: Positive for headaches. Negative for tremors and weakness.    Allergies as of 09/17/2017   No Known Allergies     Medication List        Accurate as of 09/17/17 11:59 PM. Always use your most recent med list.          ALPRAZolam 0.5 MG tablet Commonly known as:  XANAX TAKE 1/2 TO 1 TABLET TWICE DAILY   aspirin 81 MG tablet Take 81 mg by mouth daily. Reported on 11/26/2015     cholecalciferol 1000 units tablet Commonly known as:  VITAMIN D Place 2,000 Units under the tongue daily.   escitalopram 20 MG tablet Commonly known as:  LEXAPRO TAKE ONE (1) TABLET EACH DAY   GNP CALCIUM 1200 PO Take by mouth.   rizatriptan 10 MG disintegrating tablet Commonly known as:  MAXALT-MLT PLACE 1 TABLET ON TONGUE AND ALLOW TO DISSOLVE AS NEEDED FOR ONSET OFHEADACHE   topiramate 50 MG tablet Commonly known as:  TOPAMAX Take 3 tablets (150 mg total) by mouth daily.          Objective:    BP 136/82   Pulse 67   Temp (!) 97.2 F (36.2 C) (Oral)   Ht 5' 1"  (1.549 m)   Wt 124 lb (56.2 kg)   BMI 23.43 kg/m   No Known Allergies  Physical Exam  Constitutional: She is oriented to person, place, and time. She appears well-developed and well-nourished.  HENT:  Head: Normocephalic and atraumatic.  Right Ear: Tympanic membrane, external ear and ear canal normal.  Left Ear: Tympanic membrane, external ear and ear canal normal.  Nose: Nose normal. No rhinorrhea.  Mouth/Throat: Oropharynx is clear and moist and mucous membranes are normal. No oropharyngeal exudate or posterior oropharyngeal  erythema.  Eyes: Conjunctivae and EOM are normal. Pupils are equal, round, and reactive to light.  Neck: Normal range of motion. Neck supple.  Cardiovascular: Normal rate, regular rhythm, normal heart sounds and intact distal pulses.  Pulmonary/Chest: Effort normal and breath sounds normal.  Abdominal: Soft. Bowel sounds are normal.  Neurological: She is alert and oriented to person, place, and time. She has normal reflexes.  Skin: Skin is warm and dry. No rash noted.  Psychiatric: She has a normal mood and affect. Her behavior is normal. Judgment and thought content normal.  Nursing note and vitals reviewed.   Results for orders placed or performed in visit on 09/17/17  CBC with Differential/Platelet  Result Value Ref Range   WBC 5.4 3.4 - 10.8 x10E3/uL   RBC 4.86 3.77 - 5.28  x10E6/uL   Hemoglobin 14.2 11.1 - 15.9 g/dL   Hematocrit 43.8 34.0 - 46.6 %   MCV 90 79 - 97 fL   MCH 29.2 26.6 - 33.0 pg   MCHC 32.4 31.5 - 35.7 g/dL   RDW 14.1 12.3 - 15.4 %   Platelets 225 150 - 379 x10E3/uL   Neutrophils 43 Not Estab. %   Lymphs 46 Not Estab. %   Monocytes 9 Not Estab. %   Eos 2 Not Estab. %   Basos 0 Not Estab. %   Neutrophils Absolute 2.3 1.4 - 7.0 x10E3/uL   Lymphocytes Absolute 2.4 0.7 - 3.1 x10E3/uL   Monocytes Absolute 0.5 0.1 - 0.9 x10E3/uL   EOS (ABSOLUTE) 0.1 0.0 - 0.4 x10E3/uL   Basophils Absolute 0.0 0.0 - 0.2 x10E3/uL   Immature Granulocytes 0 Not Estab. %   Immature Grans (Abs) 0.0 0.0 - 0.1 x10E3/uL  CMP14+EGFR  Result Value Ref Range   Glucose 96 65 - 99 mg/dL   BUN 11 8 - 27 mg/dL   Creatinine, Ser 0.91 0.57 - 1.00 mg/dL   GFR calc non Af Amer 69 >59 mL/min/1.73   GFR calc Af Amer 79 >59 mL/min/1.73   BUN/Creatinine Ratio 12 12 - 28   Sodium 143 134 - 144 mmol/L   Potassium 4.5 3.5 - 5.2 mmol/L   Chloride 106 96 - 106 mmol/L   CO2 24 20 - 29 mmol/L   Calcium 9.6 8.7 - 10.3 mg/dL   Total Protein 6.8 6.0 - 8.5 g/dL   Albumin 4.6 3.6 - 4.8 g/dL   Globulin, Total 2.2 1.5 - 4.5 g/dL   Albumin/Globulin Ratio 2.1 1.2 - 2.2   Bilirubin Total 0.5 0.0 - 1.2 mg/dL   Alkaline Phosphatase 83 39 - 117 IU/L   AST 17 0 - 40 IU/L   ALT 13 0 - 32 IU/L  Lipid panel  Result Value Ref Range   Cholesterol, Total 184 100 - 199 mg/dL   Triglycerides 61 0 - 149 mg/dL   HDL 64 >39 mg/dL   VLDL Cholesterol Cal 12 5 - 40 mg/dL   LDL Calculated 108 (H) 0 - 99 mg/dL   Chol/HDL Ratio 2.9 0.0 - 4.4 ratio  TSH  Result Value Ref Range   TSH 2.260 0.450 - 4.500 uIU/mL      Assessment & Plan:   1. Other migraine without status migrainosus, not intractable - topiramate (TOPAMAX) 50 MG tablet; Take 3 tablets (150 mg total) by mouth daily.  Dispense: 270 tablet; Refill: 3 - rizatriptan (MAXALT-MLT) 10 MG disintegrating tablet; PLACE 1 TABLET ON TONGUE AND ALLOW  TO DISSOLVE AS NEEDED FOR ONSET OFHEADACHE  Dispense: 12  tablet; Refill: 5  2. Well adult exam - CBC with Differential/Platelet - CMP14+EGFR - Lipid panel - TSH    Current Outpatient Medications:  .  ALPRAZolam (XANAX) 0.5 MG tablet, TAKE 1/2 TO 1 TABLET TWICE DAILY, Disp: 60 tablet, Rfl: 1 .  aspirin 81 MG tablet, Take 81 mg by mouth daily. Reported on 11/26/2015, Disp: , Rfl:  .  Calcium Carbonate-Vit D-Min (GNP CALCIUM 1200 PO), Take by mouth., Disp: , Rfl:  .  cholecalciferol (VITAMIN D) 1000 units tablet, Place 2,000 Units under the tongue daily. , Disp: , Rfl:  .  escitalopram (LEXAPRO) 20 MG tablet, TAKE ONE (1) TABLET EACH DAY, Disp: 90 tablet, Rfl: 1 .  rizatriptan (MAXALT-MLT) 10 MG disintegrating tablet, PLACE 1 TABLET ON TONGUE AND ALLOW TO DISSOLVE AS NEEDED FOR ONSET OFHEADACHE, Disp: 12 tablet, Rfl: 5 .  topiramate (TOPAMAX) 50 MG tablet, Take 3 tablets (150 mg total) by mouth daily., Disp: 270 tablet, Rfl: 3 Continue all other maintenance medications as listed above.  Follow up plan: Return if symptoms worsen or fail to improve.  Educational handout given for [place] PA-C [place] [address]  [place], [place] [address] [phone]   09/20/2017, 7:49 AM

## 2017-11-15 ENCOUNTER — Other Ambulatory Visit: Payer: Self-pay | Admitting: Physician Assistant

## 2018-03-18 ENCOUNTER — Other Ambulatory Visit: Payer: Self-pay | Admitting: Physician Assistant

## 2018-09-04 ENCOUNTER — Other Ambulatory Visit: Payer: Self-pay | Admitting: Physician Assistant

## 2018-09-04 DIAGNOSIS — G43809 Other migraine, not intractable, without status migrainosus: Secondary | ICD-10-CM

## 2018-09-05 NOTE — Telephone Encounter (Signed)
Pt scheduled 09/09/18 at 11:55 with Particia Nearing.

## 2018-09-05 NOTE — Telephone Encounter (Signed)
Last seen 12/18  Karen White  Needs to be seen

## 2018-09-09 ENCOUNTER — Ambulatory Visit: Payer: BC Managed Care – PPO | Admitting: Physician Assistant

## 2018-09-09 ENCOUNTER — Encounter: Payer: Self-pay | Admitting: Physician Assistant

## 2018-09-09 VITALS — BP 107/70 | HR 72 | Temp 96.6°F | Ht 61.0 in | Wt 120.0 lb

## 2018-09-09 DIAGNOSIS — J209 Acute bronchitis, unspecified: Secondary | ICD-10-CM | POA: Diagnosis not present

## 2018-09-09 DIAGNOSIS — G43809 Other migraine, not intractable, without status migrainosus: Secondary | ICD-10-CM | POA: Diagnosis not present

## 2018-09-09 DIAGNOSIS — Z780 Asymptomatic menopausal state: Secondary | ICD-10-CM | POA: Diagnosis not present

## 2018-09-09 MED ORDER — ALPRAZOLAM 0.5 MG PO TABS
ORAL_TABLET | ORAL | 1 refills | Status: DC
Start: 2018-09-09 — End: 2019-08-29

## 2018-09-09 MED ORDER — RIZATRIPTAN BENZOATE 10 MG PO TBDP: ORAL_TABLET | ORAL | 5 refills | Status: DC

## 2018-09-09 MED ORDER — AZITHROMYCIN 250 MG PO TABS: ORAL_TABLET | ORAL | 0 refills | Status: DC

## 2018-09-09 MED ORDER — TOPIRAMATE 50 MG PO TABS: 150.0000 mg | ORAL_TABLET | Freq: Every day | ORAL | 3 refills | Status: DC

## 2018-09-09 MED ORDER — ESCITALOPRAM OXALATE 20 MG PO TABS: 20.0000 mg | ORAL_TABLET | Freq: Every day | ORAL | 3 refills | Status: DC

## 2018-09-09 MED ORDER — BENZONATATE 200 MG PO CAPS: 200.0000 mg | ORAL_CAPSULE | Freq: Two times a day (BID) | ORAL | 0 refills | Status: DC | PRN

## 2018-09-11 DIAGNOSIS — G43909 Migraine, unspecified, not intractable, without status migrainosus: Secondary | ICD-10-CM | POA: Insufficient documentation

## 2018-09-11 NOTE — Progress Notes (Signed)
BP 107/70   Pulse 72   Temp (!) 96.6 F (35.9 C) (Oral)   Ht _0  (1.549 m)   Wt 120 lb (54.4 kg)   BMI 22.67 kg/m    Subjective:    Patient ID: [name], female    DOB: May 21, 1956, 62 y.o.   MRN: [id number]  HPI: Karen White is a 62 y.o. female presenting on 09/09/2018 for Medication Refill and Nasal Congestion  This patient comes in for periodic recheck on medications and conditions including migraines, menopause and current infection. She reports doing very well overall. Has been sick for about 2 weeks.  This patient has had many days of sore throat and postnasal drainage, headache at times and sinus pressure. There is copious drainage at times. Denies any fever at this time. There has been a history of sinus infections in the past.  There is cough at night. It has become more prevalent in recent days.   All medications are reviewed today. There are no reports of any problems with the medications. All of the medical conditions are reviewed and updated.  Lab work is reviewed and will be ordered as medically necessary. There are no new problems reported with today's visit.   Past Medical History:  Diagnosis Date  . Allergy   . Anxiety   . Depression   . GERD (gastroesophageal reflux disease)   . Migraine    Relevant past medical, surgical, family and social history reviewed and updated as indicated. Interim medical history since our last visit reviewed. Allergies and medications reviewed and updated. DATA REVIEWED: CHART IN EPIC  Family History reviewed for pertinent findings.  Review of Systems  Constitutional: Positive for chills and fatigue. Negative for activity change and appetite change.  HENT: Positive for congestion, postnasal drip, sinus pressure, sinus pain and sore throat.   Eyes: Negative.   Respiratory: Positive for cough. Negative for wheezing.   Cardiovascular: Negative.  Negative for chest pain, palpitations and leg swelling.  Gastrointestinal:  Negative.   Genitourinary: Negative.   Musculoskeletal: Negative.   Skin: Negative.   Neurological: Positive for headaches.    Allergies as of 09/09/2018   No Known Allergies     Medication List        Accurate as of 09/09/18 11:59 PM. Always use your most recent med list.          ALPRAZolam 0.5 MG tablet Commonly known as:  XANAX TAKE 1/2 TO 1 TABLET TWICE DAILY   aspirin 81 MG tablet Take 81 mg by mouth daily. Reported on 11/26/2015   azithromycin 250 MG tablet Commonly known as:  ZITHROMAX Take as directed   benzonatate 200 MG capsule Commonly known as:  TESSALON Take 1 capsule (200 mg total) by mouth 2 (two) times daily as needed for cough.   cholecalciferol 1000 units tablet Commonly known as:  VITAMIN D Place 2,000 Units under the tongue daily.   escitalopram 20 MG tablet Commonly known as:  LEXAPRO Take 1 tablet (20 mg total) by mouth daily.   GNP CALCIUM 1200 PO Take by mouth.   rizatriptan 10 MG disintegrating tablet Commonly known as:  MAXALT-MLT PLACE 1 TABLET ON TONGUE AND ALLOW TO DISSOLVE AS NEEDED FOR ONSET OFHEADACHE   topiramate 50 MG tablet Commonly known as:  TOPAMAX Take 3 tablets (150 mg total) by mouth daily.          Objective:    BP 107/70   Pulse 72   Temp (!)  96.6 F (35.9 C) (Oral)   Ht _0  (1.549 m)   Wt 120 lb (54.4 kg)   BMI 22.67 kg/m   No Known Allergies  Wt Readings from Last 3 Encounters:  09/09/18 120 lb (54.4 kg)  09/17/17 124 lb (56.2 kg)  09/18/16 132 lb (59.9 kg)    Physical Exam  Constitutional: She is oriented to person, place, and time. She appears well-developed and well-nourished.  HENT:  Head: Normocephalic and atraumatic.  Right Ear: A middle ear effusion is present.  Left Ear: A middle ear effusion is present.  Nose: Mucosal edema present. Right sinus exhibits no frontal sinus tenderness. Left sinus exhibits no frontal sinus tenderness.  Mouth/Throat: Posterior oropharyngeal erythema  present. No oropharyngeal exudate or tonsillar abscesses.  Eyes: Pupils are equal, round, and reactive to light. Conjunctivae and EOM are normal.  Neck: Normal range of motion.  Cardiovascular: Normal rate, regular rhythm, normal heart sounds and intact distal pulses.  Pulmonary/Chest: Effort normal and breath sounds normal.  Abdominal: Soft. Bowel sounds are normal.  Neurological: She is alert and oriented to person, place, and time. She has normal reflexes.  Skin: Skin is warm and dry. No rash noted.  Psychiatric: She has a normal mood and affect. Her behavior is normal. Judgment and thought content normal.  Nursing note and vitals reviewed.   Results for orders placed or performed in visit on 09/17/17  CBC with Differential/Platelet  Result Value Ref Range   WBC 5.4 3.4 - 10.8 x10E3/uL   RBC 4.86 3.77 - 5.28 x10E6/uL   Hemoglobin 14.2 11.1 - 15.9 g/dL   Hematocrit 43.8 34.0 - 46.6 %   MCV 90 79 - 97 fL   MCH 29.2 26.6 - 33.0 pg   MCHC 32.4 31.5 - 35.7 g/dL   RDW 14.1 12.3 - 15.4 %   Platelets 225 150 - 379 x10E3/uL   Neutrophils 43 Not Estab. %   Lymphs 46 Not Estab. %   Monocytes 9 Not Estab. %   Eos 2 Not Estab. %   Basos 0 Not Estab. %   Neutrophils Absolute 2.3 1.4 - 7.0 x10E3/uL   Lymphocytes Absolute 2.4 0.7 - 3.1 x10E3/uL   Monocytes Absolute 0.5 0.1 - 0.9 x10E3/uL   EOS (ABSOLUTE) 0.1 0.0 - 0.4 x10E3/uL   Basophils Absolute 0.0 0.0 - 0.2 x10E3/uL   Immature Granulocytes 0 Not Estab. %   Immature Grans (Abs) 0.0 0.0 - 0.1 x10E3/uL  CMP14+EGFR  Result Value Ref Range   Glucose 96 65 - 99 mg/dL   BUN 11 8 - 27 mg/dL   Creatinine, Ser 0.91 0.57 - 1.00 mg/dL   GFR calc non Af Amer 69 >59 mL/min/1.73   GFR calc Af Amer 79 >59 mL/min/1.73   BUN/Creatinine Ratio 12 12 - 28   Sodium 143 134 - 144 mmol/L   Potassium 4.5 3.5 - 5.2 mmol/L   Chloride 106 96 - 106 mmol/L   CO2 24 20 - 29 mmol/L   Calcium 9.6 8.7 - 10.3 mg/dL   Total Protein 6.8 6.0 - 8.5 g/dL   Albumin  4.6 3.6 - 4.8 g/dL   Globulin, Total 2.2 1.5 - 4.5 g/dL   Albumin/Globulin Ratio 2.1 1.2 - 2.2   Bilirubin Total 0.5 0.0 - 1.2 mg/dL   Alkaline Phosphatase 83 39 - 117 IU/L   AST 17 0 - 40 IU/L   ALT 13 0 - 32 IU/L  Lipid panel  Result Value Ref Range   Cholesterol,  Total 184 100 - 199 mg/dL   Triglycerides 61 0 - 149 mg/dL   HDL 64 >39 mg/dL   VLDL Cholesterol Cal 12 5 - 40 mg/dL   LDL Calculated 108 (H) 0 - 99 mg/dL   Chol/HDL Ratio 2.9 0.0 - 4.4 ratio  TSH  Result Value Ref Range   TSH 2.260 0.450 - 4.500 uIU/mL      Assessment & Plan:   1. Other migraine without status migrainosus, not intractable - rizatriptan (MAXALT-MLT) 10 MG disintegrating tablet; PLACE 1 TABLET ON TONGUE AND ALLOW TO DISSOLVE AS NEEDED FOR ONSET OFHEADACHE  Dispense: 12 tablet; Refill: 5 - topiramate (TOPAMAX) 50 MG tablet; Take 3 tablets (150 mg total) by mouth daily.  Dispense: 270 tablet; Refill: 3  2. Menopause Continue medications  3. Acute bronchitis, unspecified organism - azithromycin (ZITHROMAX) 250 MG tablet; Take as directed  Dispense: 6 tablet; Refill: 0 - benzonatate (TESSALON) 200 MG capsule; Take 1 capsule (200 mg total) by mouth 2 (two) times daily as needed for cough.  Dispense: 30 capsule; Refill: 0   Continue all other maintenance medications as listed above.  Follow up plan: No follow-ups on file.  Educational handout given for [place] PA-C [place] [address]  [place], [place] [address] [phone]   09/11/2018, 10:48 PM

## 2018-12-30 ENCOUNTER — Other Ambulatory Visit: Payer: Self-pay | Admitting: Physician Assistant

## 2019-08-01 ENCOUNTER — Other Ambulatory Visit: Payer: Self-pay | Admitting: Physician Assistant

## 2019-08-01 DIAGNOSIS — G43809 Other migraine, not intractable, without status migrainosus: Secondary | ICD-10-CM

## 2019-08-26 ENCOUNTER — Other Ambulatory Visit: Payer: Self-pay | Admitting: Physician Assistant

## 2019-08-29 ENCOUNTER — Ambulatory Visit (INDEPENDENT_AMBULATORY_CARE_PROVIDER_SITE_OTHER): Payer: BC Managed Care – PPO | Admitting: Physician Assistant

## 2019-08-29 ENCOUNTER — Encounter: Payer: Self-pay | Admitting: Physician Assistant

## 2019-08-29 DIAGNOSIS — F325 Major depressive disorder, single episode, in full remission: Secondary | ICD-10-CM | POA: Diagnosis not present

## 2019-08-29 DIAGNOSIS — G43909 Migraine, unspecified, not intractable, without status migrainosus: Secondary | ICD-10-CM

## 2019-08-29 DIAGNOSIS — Z0000 Encounter for general adult medical examination without abnormal findings: Secondary | ICD-10-CM

## 2019-08-29 MED ORDER — TOPIRAMATE 50 MG PO TABS: 150.0000 mg | ORAL_TABLET | Freq: Every day | ORAL | 3 refills | Status: DC

## 2019-08-29 MED ORDER — ESCITALOPRAM OXALATE 20 MG PO TABS: 20.0000 mg | ORAL_TABLET | Freq: Every day | ORAL | 3 refills | Status: DC

## 2019-08-29 MED ORDER — RIZATRIPTAN BENZOATE 10 MG PO TBDP: ORAL_TABLET | ORAL | 5 refills | Status: DC

## 2019-08-29 MED ORDER — ALPRAZOLAM 0.5 MG PO TABS: ORAL_TABLET | ORAL | 1 refills | Status: DC

## 2019-09-03 NOTE — Progress Notes (Signed)
Telephone visit  Subjective: CC: Recheck chronic medical conditions PCP: Terald Sleeper, PA-C ZOX:WRUEA K Balinski is a 63 y.o. female calls for telephone consult today. Patient provides verbal consent for consult held via phone.  Patient is identified with 2 separate identifiers.  At this time the entire area is on COVID-19 social distancing and stay home orders are in place.  Patient is of higher risk and therefore we are performing this by a virtual method.  Location of patient: Home Location of provider: HOME Others present for call: No  Patient is have a follow-up on her chronic medical conditions which do include migraine, depression with anxiety.  She states that overall she is doing very good with her depression anxiety.  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  She reports that it is a good process..  She has gone to her gynecologist for her mammogram and well exam.  She states that she will get the report to our office.  She does need annual labs performed an order will be placed and she will come in the near future.   ROS: Per HPI  No Known Allergies Past Medical History:  Diagnosis Date  . Allergy   . Anxiety   . Depression   . GERD (gastroesophageal reflux disease)   . Migraine     Current Outpatient Medications:  .  ALPRAZolam (XANAX) 0.5 MG tablet, TAKE 1/2 TO 1 TABLET TWICE DAILY, Disp: 180 tablet, Rfl: 1 .  aspirin 81 MG tablet, Take 81 mg by mouth daily. Reported on 11/26/2015, Disp: , Rfl:  .  azithromycin (ZITHROMAX) 250 MG tablet, Take as directed, Disp: 6 tablet, Rfl: 0 .  benzonatate (TESSALON) 200 MG capsule, Take 1 capsule (200 mg total) by mouth 2 (two) times daily as needed for cough., Disp: 30 capsule, Rfl: 0 .  Calcium Carbonate-Vit D-Min (GNP CALCIUM 1200 PO), Take by mouth., Disp: , Rfl:  .  cholecalciferol (VITAMIN D) 1000 units tablet, Place 2,000 Units  under the tongue daily. , Disp: , Rfl:  .  escitalopram (LEXAPRO) 20 MG tablet, Take 1 tablet (20 mg total) by mouth daily., Disp: 90 tablet, Rfl: 3 .  rizatriptan (MAXALT-MLT) 10 MG disintegrating tablet, PLACE 1 TABLET ON TONGUE AND ALLOW TO DISSOLVE AS NEEDED FOR ONSET OFHEADACHE, Disp: 12 tablet, Rfl: 5 .  topiramate (TOPAMAX) 50 MG tablet, Take 3 tablets (150 mg total) by mouth daily., Disp: 270 tablet, Rfl: 3  Assessment/ Plan: 63 y.o. female   1. Migraine without status migrainosus, not intractable, unspecified migraine type - rizatriptan (MAXALT-MLT) 10 MG disintegrating tablet; PLACE 1 TABLET ON TONGUE AND ALLOW TO DISSOLVE AS NEEDED FOR ONSET OFHEADACHE  Dispense: 12 tablet; Refill: 5 - topiramate (TOPAMAX) 50 MG tablet; Take 3 tablets (150 mg total) by mouth daily.  Dispense: 270 tablet; Refill: 3  2. Major depressive disorder with single episode, in full remission ([place]) - escitalopram (LEXAPRO) 20 MG tablet; Take 1 tablet (20 mg total) by mouth daily.  Dispense: 90 tablet; Refill: 3 - ALPRAZolam (XANAX) 0.5 MG tablet; TAKE 1/2 TO 1 TABLET TWICE DAILY  Dispense: 180 tablet; Refill: 1  3. Well adult exam - CBC with Differential/Platelet; Future - CMP14+EGFR; Future - Lipid Panel; Future - TSH; Future   No follow-ups on file.  Continue all other maintenance medications as listed above.  Start time: 3:45 PM End time: 3:55  PM  Meds ordered this encounter  Medications  . escitalopram (LEXAPRO) 20 MG tablet    Sig: Take 1 tablet (20 mg total) by mouth daily.    Dispense:  90 tablet    Refill:  3    Order Specific Question:   Supervising Provider    Answer:   Janora Norlander [[id number]]  . rizatriptan (MAXALT-MLT) 10 MG disintegrating tablet    Sig: PLACE 1 TABLET ON TONGUE AND ALLOW TO DISSOLVE AS NEEDED FOR ONSET OFHEADACHE    Dispense:  12 tablet    Refill:  5    Order Specific Question:   Supervising Provider    Answer:   Janora Norlander [[id number]]  .  topiramate (TOPAMAX) 50 MG tablet    Sig: Take 3 tablets (150 mg total) by mouth daily.    Dispense:  270 tablet    Refill:  3    Order Specific Question:   Supervising Provider    Answer:   Janora Norlander [[id number]]  . ALPRAZolam (XANAX) 0.5 MG tablet    Sig: TAKE 1/2 TO 1 TABLET TWICE DAILY    Dispense:  180 tablet    Refill:  1    Order Specific Question:   Supervising Provider    Answer:   Janora Norlander [[id number]]    Particia Nearing PA-C [place] [phone]

## 2020-02-12 ENCOUNTER — Other Ambulatory Visit: Payer: Self-pay | Admitting: *Deleted

## 2020-02-12 DIAGNOSIS — F325 Major depressive disorder, single episode, in full remission: Secondary | ICD-10-CM

## 2020-02-21 ENCOUNTER — Other Ambulatory Visit: Payer: Self-pay

## 2020-02-21 ENCOUNTER — Encounter: Payer: Self-pay | Admitting: Family Medicine

## 2020-02-21 ENCOUNTER — Ambulatory Visit: Payer: BC Managed Care – PPO | Admitting: Family Medicine

## 2020-02-21 VITALS — BP 121/76 | HR 71 | Temp 97.3°F | Ht 61.0 in | Wt 123.0 lb

## 2020-02-21 DIAGNOSIS — F325 Major depressive disorder, single episode, in full remission: Secondary | ICD-10-CM | POA: Diagnosis not present

## 2020-02-21 DIAGNOSIS — G43909 Migraine, unspecified, not intractable, without status migrainosus: Secondary | ICD-10-CM

## 2020-02-21 DIAGNOSIS — Z7689 Persons encountering health services in other specified circumstances: Secondary | ICD-10-CM

## 2020-02-21 DIAGNOSIS — F411 Generalized anxiety disorder: Secondary | ICD-10-CM | POA: Diagnosis not present

## 2020-02-21 DIAGNOSIS — F410 Panic disorder [episodic paroxysmal anxiety] without agoraphobia: Secondary | ICD-10-CM | POA: Insufficient documentation

## 2020-02-21 DIAGNOSIS — Z79899 Other long term (current) drug therapy: Secondary | ICD-10-CM

## 2020-02-21 MED ORDER — ALPRAZOLAM 0.5 MG PO TABS: 0.2500 mg | ORAL_TABLET | Freq: Every evening | ORAL | 1 refills | Status: DC | PRN

## 2020-02-21 NOTE — Progress Notes (Signed)
Subjective: CC: Establish care, migraine headaches, depression with anxiety PCP: Janora Norlander, DO BLT:JQZES K Riehle is a 64 y.o. female presenting to clinic today for:  1.  Depression with anxiety Patient reports excellent control of symptoms on Lexapro and nightly use of alprazolam 0.25 to 0.5 mg.  She notes that typically the alprazolam is the only thing that really calms down her mind.  Often she will have racing thoughts that prevent her from going to sleep.  Sometimes she will stay up for several hours after lying down if she does not take the medication.  Does not report any daytime sleepiness, mental status changes including hallucinations or memory disturbance.  No falls.  No respiratory depression.  2.  Migraine headaches Patient reports compliance with Topamax daily.  She needs the Maxalt less than 1 time per month.   ROS: Per HPI  No Known Allergies Past Medical History:  Diagnosis Date  . Allergy   . Anxiety   . Depression   . GERD (gastroesophageal reflux disease)   . Migraine     Current Outpatient Medications:  .  ALPRAZolam (XANAX) 0.5 MG tablet, TAKE 1/2 TO 1 TABLET TWICE DAILY, Disp: 180 tablet, Rfl: 1 .  aspirin 81 MG tablet, Take 81 mg by mouth daily. Reported on 11/26/2015, Disp: , Rfl:  .  benzonatate (TESSALON) 200 MG capsule, Take 1 capsule (200 mg total) by mouth 2 (two) times daily as needed for cough., Disp: 30 capsule, Rfl: 0 .  Calcium Carbonate-Vit D-Min (GNP CALCIUM 1200 PO), Take by mouth., Disp: , Rfl:  .  cholecalciferol (VITAMIN D) 1000 units tablet, Place 2,000 Units under the tongue daily. , Disp: , Rfl:  .  escitalopram (LEXAPRO) 20 MG tablet, Take 1 tablet (20 mg total) by mouth daily., Disp: 90 tablet, Rfl: 3 .  rizatriptan (MAXALT-MLT) 10 MG disintegrating tablet, PLACE 1 TABLET ON TONGUE AND ALLOW TO DISSOLVE AS NEEDED FOR ONSET OFHEADACHE, Disp: 12 tablet, Rfl: 5 .  topiramate (TOPAMAX) 50 MG tablet, Take 3 tablets (150 mg total)  by mouth daily., Disp: 270 tablet, Rfl: 3 Social History   Socioeconomic History  . Marital status: Single    Spouse name: Not on file  . Number of children: Not on file  . Years of education: Not on file  . Highest education level: Not on file  Occupational History  . Not on file  Tobacco Use  . Smoking status: Former Smoker    Packs/day: 0.50    Types: Cigarettes    Quit date: 01/06/1976    Years since quitting: 44.1  . Smokeless tobacco: Never Used  Substance and Sexual Activity  . Alcohol use: Yes    Alcohol/week: 2.0 standard drinks    Types: 2 Glasses of wine per week    Comment: One drink a week  . Drug use: No  . Sexual activity: Yes    Birth control/protection: Post-menopausal  Other Topics Concern  . Not on file  Social History Narrative  . Not on file   Social Determinants of Health   Financial Resource Strain:   . Difficulty of Paying Living Expenses:   Food Insecurity:   . Worried About Charity fundraiser in the Last Year:   . Arboriculturist in the Last Year:   Transportation Needs:   . Film/video editor (Medical):   Marland Kitchen Lack of Transportation (Non-Medical):   Physical Activity:   . Days of Exercise per Week:   .  Minutes of Exercise per Session:   Stress:   . Feeling of Stress :   Social Connections:   . Frequency of Communication with Friends and Family:   . Frequency of Social Gatherings with Friends and Family:   . Attends Religious Services:   . Active Member of Clubs or Organizations:   . Attends Archivist Meetings:   Marland Kitchen Marital Status:   Intimate Partner Violence:   . Fear of Current or Ex-Partner:   . Emotionally Abused:   Marland Kitchen Physically Abused:   . Sexually Abused:    Family History  Problem Relation Age of Onset  . Coronary artery disease Father        Age 62s  . Heart disease Father   . Coronary artery disease Mother        Age 105s  . Cancer Mother   . Cancer Maternal Grandmother        ovarian  . Colon cancer Neg  Hx   . Colon polyps Neg Hx   . Esophageal cancer Neg Hx   . Rectal cancer Neg Hx   . Stomach cancer Neg Hx     Objective: Office vital signs reviewed. BP 121/76   Pulse 71   Temp (!) 97.3 F (36.3 C) (Temporal)   Ht _0  (1.549 m)   Wt 123 lb (55.8 kg)   SpO2 100%   BMI 23.24 kg/m   Physical Examination:  General: Awake, alert, well nourished, well appearing female. No acute distress HEENT: Normal, sclera white, MMM Cardio: regular rate and rhythm, S1S2 heard, no murmurs appreciated Pulm: clear to auscultation bilaterally, no wheezes, rhonchi or rales; normal work of breathing on room air Extremities: warm, well perfused, No edema, cyanosis or clubbing; +2 pulses bilaterally MSK: normal gait and station Skin: dry; intact; no rashes or lesions Neuro: no tremor Psych: Mood stable, speech normal, affect appropriate, pleasant and interactive. Depression screen [facility] 2/9 02/21/2020 09/09/2018 09/17/2017  Decreased Interest 0 0 0  Down, Depressed, Hopeless 0 0 0  PHQ - 2 Score 0 0 0  Altered sleeping 0 - -  Tired, decreased energy 0 - -  Change in appetite 0 - -  Feeling bad or failure about yourself  0 - -  Trouble concentrating 0 - -  Moving slowly or fidgety/restless 0 - -  Suicidal thoughts 0 - -  PHQ-9 Score 0 - -   Assessment/ Plan: 64 y.o. female   1. Major depressive disorder with single episode, in full remission ([place]) In remission.  Stable with Lexapro - CMP14+EGFR  2. Generalized anxiety disorder with panic attacks Regular use of very low-dose alprazolam.  Typically only 0.25 mg nightly.  I have changed her prescription to reflect current use.  The national narcotic database was reviewed and there were no red flags.  Controlled substance contract and UDS was updated per office policy.  She will see me back in 6 months unless she needs me sooner - CMP14+EGFR - TSH - ALPRAZolam (XANAX) 0.5 MG tablet; Take 0.5-1 tablets (0.25-0.5 mg total) by mouth at bedtime as  needed for anxiety.  Dispense: 90 tablet; Refill: 1  3. Migraine without status migrainosus, not intractable, unspecified migraine type Stable - CBC with Differential  4. Establishing care with new doctor, encounter for  5. Controlled substance agreement signed - [employer] [place] [employer] ([place]), Urine   No orders of the defined types were placed in this encounter.  No orders of the defined types were placed in  this encounter.    Janora Norlander, DO [place] [phone]

## 2020-02-22 LAB — CBC WITH DIFFERENTIAL/PLATELET
Basophils Absolute: 0 10*3/uL (ref 0.0–0.2)
Basos: 0 %
EOS (ABSOLUTE): 0.1 10*3/uL (ref 0.0–0.4)
Eos: 2 %
Hematocrit: 45.9 % (ref 34.0–46.6)
Hemoglobin: 14.6 g/dL (ref 11.1–15.9)
Immature Grans (Abs): 0 10*3/uL (ref 0.0–0.1)
Immature Granulocytes: 0 %
Lymphocytes Absolute: 2.3 10*3/uL (ref 0.7–3.1)
Lymphs: 34 %
MCH: 28.2 pg (ref 26.6–33.0)
MCHC: 31.8 g/dL (ref 31.5–35.7)
MCV: 89 fL (ref 79–97)
Monocytes Absolute: 0.5 10*3/uL (ref 0.1–0.9)
Monocytes: 7 %
Neutrophils Absolute: 3.8 10*3/uL (ref 1.4–7.0)
Neutrophils: 57 %
Platelets: 219 10*3/uL (ref 150–450)
RBC: 5.18 x10E6/uL (ref 3.77–5.28)
RDW: 13.2 % (ref 11.7–15.4)
WBC: 6.7 10*3/uL (ref 3.4–10.8)

## 2020-02-22 LAB — CMP14+EGFR
ALT: 16 IU/L (ref 0–32)
AST: 19 IU/L (ref 0–40)
Albumin/Globulin Ratio: 2 (ref 1.2–2.2)
Albumin: 4.4 g/dL (ref 3.8–4.8)
Alkaline Phosphatase: 83 IU/L (ref 48–121)
BUN/Creatinine Ratio: 23 (ref 12–28)
BUN: 21 mg/dL (ref 8–27)
Bilirubin Total: 0.3 mg/dL (ref 0.0–1.2)
CO2: 22 mmol/L (ref 20–29)
Calcium: 9.4 mg/dL (ref 8.7–10.3)
Chloride: 103 mmol/L (ref 96–106)
Creatinine, Ser: 0.9 mg/dL (ref 0.57–1.00)
GFR calc Af Amer: 79 mL/min/{1.73_m2} (ref >59)
GFR calc non Af Amer: 69 mL/min/{1.73_m2} (ref >59)
Globulin, Total: 2.2 g/dL (ref 1.5–4.5)
Glucose: 93 mg/dL (ref 65–99)
Potassium: 4.1 mmol/L (ref 3.5–5.2)
Sodium: 138 mmol/L (ref 134–144)
Total Protein: 6.6 g/dL (ref 6.0–8.5)

## 2020-02-22 LAB — TSH: TSH: 1.58 u[IU]/mL (ref 0.450–4.500)

## 2020-02-24 LAB — TOXASSURE SELECT 13 (MW), URINE

## 2020-08-01 ENCOUNTER — Ambulatory Visit: Payer: BC Managed Care – PPO | Attending: Internal Medicine

## 2020-08-01 DIAGNOSIS — Z23 Encounter for immunization: Secondary | ICD-10-CM

## 2020-08-01 NOTE — Progress Notes (Signed)
   Covid-19 Vaccination Clinic  Name:  Karen White    MRN: [id number] DOB: [birth year]  08/01/2020  [name] was observed post Covid-19 immunization for 15 minutes without incident. She was provided with Vaccine Information Sheet and instruction to access the V-Safe system.   [name] was instructed to call 911 with any severe reactions post vaccine: Marland Kitchen Difficulty breathing  . Swelling of face and throat  . A fast heartbeat  . A bad rash all over body  . Dizziness and weakness

## 2020-08-06 ENCOUNTER — Encounter: Payer: Self-pay | Admitting: Family Medicine

## 2020-11-29 ENCOUNTER — Telehealth: Payer: Self-pay

## 2020-11-29 ENCOUNTER — Other Ambulatory Visit: Payer: Self-pay | Admitting: Family Medicine

## 2020-11-29 DIAGNOSIS — Z130 Encounter for screening for diseases of the blood and blood-forming organs and certain disorders involving the immune mechanism: Secondary | ICD-10-CM

## 2020-11-29 DIAGNOSIS — F325 Major depressive disorder, single episode, in full remission: Secondary | ICD-10-CM

## 2020-11-29 DIAGNOSIS — Z13228 Encounter for screening for other metabolic disorders: Secondary | ICD-10-CM

## 2020-11-29 DIAGNOSIS — Z780 Asymptomatic menopausal state: Secondary | ICD-10-CM

## 2020-11-29 DIAGNOSIS — Z13220 Encounter for screening for lipoid disorders: Secondary | ICD-10-CM

## 2020-11-29 NOTE — Telephone Encounter (Signed)
Orders are in  Also placed DEXA scan order. Last 2018 normal. Can have this done as well if she wants.  Orders Placed This Encounter  Procedures  . DG [facility] DEXA    Standing Status:   Future    Standing Expiration Date:   11/29/2021    Order Specific Question:   Reason for Exam (SYMPTOM  OR DIAGNOSIS REQUIRED)    Answer:   screen osteoporosis  . CMP14+EGFR    Standing Status:   Future    Standing Expiration Date:   11/29/2021  . Lipid panel    Standing Status:   Future    Standing Expiration Date:   11/29/2021  . CBC    Standing Status:   Future    Standing Expiration Date:   11/29/2021  . TSH    Standing Status:   Future    Standing Expiration Date:   11/29/2021

## 2020-11-29 NOTE — Telephone Encounter (Signed)
lmtcb

## 2020-11-29 NOTE — Telephone Encounter (Signed)
See below

## 2020-11-29 NOTE — Telephone Encounter (Signed)
rc for nurse 

## 2020-12-24 NOTE — Telephone Encounter (Signed)
Future orders were placed and attempts to contact pt without a return a call in over 3 days, will close encounter.

## 2020-12-30 ENCOUNTER — Other Ambulatory Visit: Payer: Self-pay

## 2020-12-30 ENCOUNTER — Other Ambulatory Visit: Payer: BC Managed Care – PPO

## 2020-12-30 DIAGNOSIS — Z13220 Encounter for screening for lipoid disorders: Secondary | ICD-10-CM

## 2020-12-30 DIAGNOSIS — Z13228 Encounter for screening for other metabolic disorders: Secondary | ICD-10-CM

## 2020-12-30 DIAGNOSIS — Z130 Encounter for screening for diseases of the blood and blood-forming organs and certain disorders involving the immune mechanism: Secondary | ICD-10-CM

## 2020-12-31 ENCOUNTER — Encounter: Payer: Self-pay | Admitting: Family Medicine

## 2020-12-31 DIAGNOSIS — E7800 Pure hypercholesterolemia, unspecified: Secondary | ICD-10-CM | POA: Insufficient documentation

## 2020-12-31 LAB — CMP14+EGFR
ALT: 14 IU/L (ref 0–32)
AST: 21 IU/L (ref 0–40)
Albumin/Globulin Ratio: 2 (ref 1.2–2.2)
Albumin: 4.5 g/dL (ref 3.8–4.8)
Alkaline Phosphatase: 82 IU/L (ref 44–121)
BUN/Creatinine Ratio: 14 (ref 12–28)
BUN: 13 mg/dL (ref 8–27)
Bilirubin Total: 0.5 mg/dL (ref 0.0–1.2)
CO2: 20 mmol/L (ref 20–29)
Calcium: 9.5 mg/dL (ref 8.7–10.3)
Chloride: 104 mmol/L (ref 96–106)
Creatinine, Ser: 0.9 mg/dL (ref 0.57–1.00)
Globulin, Total: 2.3 g/dL (ref 1.5–4.5)
Glucose: 93 mg/dL (ref 65–99)
Potassium: 4.2 mmol/L (ref 3.5–5.2)
Sodium: 139 mmol/L (ref 134–144)
Total Protein: 6.8 g/dL (ref 6.0–8.5)
eGFR: 72 mL/min/{1.73_m2} (ref >59)

## 2020-12-31 LAB — CBC
Hematocrit: 45.3 % (ref 34.0–46.6)
Hemoglobin: 14.9 g/dL (ref 11.1–15.9)
MCH: 29 pg (ref 26.6–33.0)
MCHC: 32.9 g/dL (ref 31.5–35.7)
MCV: 88 fL (ref 79–97)
Platelets: 197 10*3/uL (ref 150–450)
RBC: 5.14 x10E6/uL (ref 3.77–5.28)
RDW: 13.1 % (ref 11.7–15.4)
WBC: 5.8 10*3/uL (ref 3.4–10.8)

## 2020-12-31 LAB — TSH: TSH: 3.27 u[IU]/mL (ref 0.450–4.500)

## 2020-12-31 LAB — LIPID PANEL
Chol/HDL Ratio: 3.1 ratio (ref 0.0–4.4)
Cholesterol, Total: 203 mg/dL — ABNORMAL HIGH (ref 100–199)
HDL: 66 mg/dL (ref >39)
LDL Chol Calc (NIH): 124 mg/dL — ABNORMAL HIGH (ref 0–99)
Triglycerides: 71 mg/dL (ref 0–149)
VLDL Cholesterol Cal: 13 mg/dL (ref 5–40)

## 2021-01-01 ENCOUNTER — Encounter: Payer: BC Managed Care – PPO | Admitting: Family Medicine

## 2021-01-01 ENCOUNTER — Other Ambulatory Visit: Payer: BC Managed Care – PPO

## 2021-01-21 ENCOUNTER — Encounter: Payer: Self-pay | Admitting: Family Medicine

## 2021-01-21 ENCOUNTER — Ambulatory Visit (INDEPENDENT_AMBULATORY_CARE_PROVIDER_SITE_OTHER): Payer: BC Managed Care – PPO | Admitting: Family Medicine

## 2021-01-21 ENCOUNTER — Other Ambulatory Visit: Payer: Self-pay

## 2021-01-21 VITALS — BP 109/66 | HR 70 | Temp 97.4°F | Ht 61.0 in | Wt 124.6 lb

## 2021-01-21 DIAGNOSIS — F411 Generalized anxiety disorder: Secondary | ICD-10-CM | POA: Diagnosis not present

## 2021-01-21 DIAGNOSIS — F410 Panic disorder [episodic paroxysmal anxiety] without agoraphobia: Secondary | ICD-10-CM

## 2021-01-21 DIAGNOSIS — Z79899 Other long term (current) drug therapy: Secondary | ICD-10-CM | POA: Diagnosis not present

## 2021-01-21 DIAGNOSIS — E7800 Pure hypercholesterolemia, unspecified: Secondary | ICD-10-CM

## 2021-01-21 DIAGNOSIS — Z0001 Encounter for general adult medical examination with abnormal findings: Secondary | ICD-10-CM

## 2021-01-21 DIAGNOSIS — F325 Major depressive disorder, single episode, in full remission: Secondary | ICD-10-CM | POA: Diagnosis not present

## 2021-01-21 DIAGNOSIS — Z0000 Encounter for general adult medical examination without abnormal findings: Secondary | ICD-10-CM

## 2021-01-21 DIAGNOSIS — G43909 Migraine, unspecified, not intractable, without status migrainosus: Secondary | ICD-10-CM

## 2021-01-21 MED ORDER — ESCITALOPRAM OXALATE 20 MG PO TABS: ORAL_TABLET | ORAL | 3 refills | Status: DC

## 2021-01-21 MED ORDER — TOPIRAMATE 50 MG PO TABS: 150.0000 mg | ORAL_TABLET | Freq: Every day | ORAL | 3 refills | Status: DC

## 2021-01-21 MED ORDER — ALPRAZOLAM 0.5 MG PO TABS: 0.2500 mg | ORAL_TABLET | Freq: Every evening | ORAL | 1 refills | Status: DC | PRN

## 2021-01-21 NOTE — Progress Notes (Signed)
Karen White is a 65 y.o. female presents to office today for annual physical exam examination.    Concerns today include: 1. GAD w/ panic Patient reports that she is have some more issues with falling asleep.  She admits she has been out of her Xanax for a bit.  She has multiple things going on including a potential trip to [place] soon.  Diet: Balanced, Exercise: regular Last eye exam: UTD Last dental exam: UTD Last colonoscopy: UTD Last mammogram: UTD Last pap smear: UTD, sees OB/GYN, ROI completed Refills needed today: all Immunizations needed:  Immunization History  Administered Date(s) Administered  . PFIZER(Purple Top)SARS-COV-2 Vaccination 12/03/2019, 12/30/2019, 08/01/2020  . Tdap 04/19/2014     Past Medical History:  Diagnosis Date  . Allergy   . Anxiety   . Depression   . GERD (gastroesophageal reflux disease)   . Migraine    Social History   Socioeconomic History  . Marital status: Single    Spouse name: Not on file  . Number of children: Not on file  . Years of education: Not on file  . Highest education level: Not on file  Occupational History  . Not on file  Tobacco Use  . Smoking status: Former Smoker    Packs/day: 0.50    Types: Cigarettes    Quit date: 01/06/1976    Years since quitting: 45.0  . Smokeless tobacco: Never Used  Substance and Sexual Activity  . Alcohol use: Yes    Alcohol/week: 2.0 standard drinks    Types: 2 Glasses of wine per week    Comment: One drink a week  . Drug use: No  . Sexual activity: Yes    Birth control/protection: Post-menopausal  Other Topics Concern  . Not on file  Social History Narrative  . Not on file   Social Determinants of Health   Financial Resource Strain: Not on file  Food Insecurity: Not on file  Transportation Needs: Not on file  Physical Activity: Not on file  Stress: Not on file  Social Connections: Not on file  Intimate Partner Violence: Not on file   Past Surgical History:   Procedure Laterality Date  . mercury posion from fillings    . Schwanoma tumor resection    . TONGUE BIOPSY     Family History  Problem Relation Age of Onset  . Coronary artery disease Father        Age 63s  . Heart disease Father   . Coronary artery disease Mother        Age 61s  . Cancer Mother   . Cancer Maternal Grandmother        ovarian  . Colon cancer Neg Hx   . Colon polyps Neg Hx   . Esophageal cancer Neg Hx   . Rectal cancer Neg Hx   . Stomach cancer Neg Hx     Current Outpatient Medications:  .  ALPRAZolam (XANAX) 0.5 MG tablet, Take 0.5-1 tablets (0.25-0.5 mg total) by mouth at bedtime as needed for anxiety., Disp: 90 tablet, Rfl: 1 .  Calcium Carbonate-Vit D-Min (GNP CALCIUM 1200 PO), Take by mouth., Disp: , Rfl:  .  cholecalciferol (VITAMIN D) 1000 units tablet, Place 2,000 Units under the tongue daily. , Disp: , Rfl:  .  escitalopram (LEXAPRO) 20 MG tablet, TAKE ONE (1) TABLET EACH DAY, Disp: 90 tablet, Rfl: 0 .  rizatriptan (MAXALT-MLT) 10 MG disintegrating tablet, PLACE 1 TABLET ON TONGUE AND ALLOW TO DISSOLVE AS NEEDED FOR ONSET  OFHEADACHE, Disp: 12 tablet, Rfl: 5 .  topiramate (TOPAMAX) 50 MG tablet, Take 3 tablets (150 mg total) by mouth daily., Disp: 270 tablet, Rfl: 3  No Known Allergies   ROS: Review of Systems Pertinent items noted in HPI and remainder of comprehensive ROS otherwise negative.    Physical exam BP 109/66   Pulse 70   Temp (!) 97.4 F (36.3 C)   Ht 5\' 1"  (1.549 m)   Wt 124 lb 9.6 oz (56.5 kg)   SpO2 100%   BMI 23.54 kg/m  General appearance: alert, cooperative, appears stated age and no distress Head: Normocephalic, without obvious abnormality, atraumatic Eyes: negative findings: lids and lashes normal, conjunctivae and sclerae normal, corneas clear, pupils equal, round, reactive to light and accomodation and visual fields full to confrontation Ears: normal TM's and external ear canals both ears Nose: Nares normal. Septum  midline. Mucosa normal. No drainage or sinus tenderness. Throat: lips, mucosa, and tongue normal; teeth and gums normal Neck: no adenopathy, no carotid bruit, supple, symmetrical, trachea midline and thyroid not enlarged, symmetric, no tenderness/mass/nodules Back: symmetric, no curvature. ROM normal. No CVA tenderness. Lungs: clear to auscultation bilaterally Heart: regular rate and rhythm, S1, S2 normal, no murmur, click, rub or gallop Abdomen: soft, non-tender; bowel sounds normal; no masses,  no organomegaly Extremities: extremities normal, atraumatic, no cyanosis or edema Pulses: 2+ and symmetric Skin: Skin color, texture, turgor normal. No rashes or lesions Lymph nodes: Cervical, supraclavicular, and axillary nodes normal. Neurologic: Alert and oriented X 3, normal strength and tone. Normal symmetric reflexes. Normal coordination and gait Psych mood stable, speech normal, affect appropriate.  Patient is pleasant and interactive   Assessment/ Plan: [name] here for annual physical exam.   Annual physical exam  Pure hypercholesterolemia - Plan: Lipid panel  Generalized anxiety disorder with panic attacks - Plan: ToxASSURE Select 13 (MW), Urine, ALPRAZolam (XANAX) 0.5 MG tablet, escitalopram (LEXAPRO) 20 MG tablet  Controlled substance agreement signed - Plan: ToxASSURE Select 13 (MW), Urine  Migraine without status migrainosus, not intractable, unspecified migraine type - Plan: topiramate (TOPAMAX) 50 MG tablet  Major depressive disorder with single episode, in full remission ([place])  CSA and UDS were obtained as per office policy.  No red flags with her as needed Xanax use.  Renewal has been sent.  Okay to perform video visit as long as her insurance still covers in 6 months for renewal.  We will see each other back in office in 1 year.  The Narcotic Database has been reviewed.  There were no red flags.  Last fill of Xanax 08/2020.  Uncertain etiology of rising  cholesterol but we will recheck a fasting lipid panel in 6 months.  She is to continue adequate exercise and well-balanced diet.  ASCVD risk score was less than 7% so I do not think that she needs cholesterol medicine at this time but will start it if needed going forward.  Patient to follow up in 1 year for annual exam or sooner if needed.  Kaleah Hagemeister M. Lajuana Ripple, DO

## 2021-01-28 LAB — TOXASSURE SELECT 13 (MW), URINE

## 2021-01-29 ENCOUNTER — Encounter: Payer: Self-pay | Admitting: Family Medicine

## 2021-04-19 ENCOUNTER — Other Ambulatory Visit: Payer: Self-pay | Admitting: Family Medicine

## 2021-04-19 DIAGNOSIS — F410 Panic disorder [episodic paroxysmal anxiety] without agoraphobia: Secondary | ICD-10-CM

## 2021-05-27 ENCOUNTER — Encounter: Payer: Self-pay | Admitting: Family Medicine

## 2021-05-27 ENCOUNTER — Other Ambulatory Visit: Payer: Self-pay | Admitting: Family Medicine

## 2021-05-27 DIAGNOSIS — U071 COVID-19: Secondary | ICD-10-CM

## 2021-05-27 MED ORDER — MOLNUPIRAVIR EUA 200MG CAPSULE: 4.0000 | ORAL_CAPSULE | Freq: Two times a day (BID) | ORAL | 0 refills | Status: AC

## 2021-07-09 ENCOUNTER — Encounter: Payer: Self-pay | Admitting: Family Medicine

## 2021-07-16 ENCOUNTER — Ambulatory Visit (INDEPENDENT_AMBULATORY_CARE_PROVIDER_SITE_OTHER): Payer: BC Managed Care – PPO | Admitting: Family Medicine

## 2021-07-16 ENCOUNTER — Encounter: Payer: Self-pay | Admitting: Family Medicine

## 2021-07-16 VITALS — BP 131/66 | HR 73 | Temp 97.2°F

## 2021-07-16 DIAGNOSIS — R051 Acute cough: Secondary | ICD-10-CM

## 2021-07-16 MED ORDER — PREDNISONE 20 MG PO TABS: 20.0000 mg | ORAL_TABLET | Freq: Every day | ORAL | 0 refills | Status: AC

## 2021-07-16 MED ORDER — BENZONATATE 100 MG PO CAPS
100.0000 mg | ORAL_CAPSULE | Freq: Three times a day (TID) | ORAL | 0 refills | Status: DC | PRN
Start: 2021-07-16 — End: 2021-07-28

## 2021-07-16 NOTE — Progress Notes (Signed)
Acute Office Visit  Subjective:    Patient ID: Karen White, female    DOB: 02-14-1956, 65 y.o.   MRN: [id number]  Chief Complaint  Patient presents with   Cough   Nasal Congestion    HPI Patient is in today for a cough x 1 week. It is a dry cough. She has also had some slight nasal congestion and feels fatigued. She denies shortness of breath, chest pain, fever, chills, body aches, sore throat, nausea, vomiting, or diarrhea. She has taken mucinex, robotussin, delsyum, cough drops, humidifier, and honey without improvement.  She had Covid about 1 month ago. She has had 3 negative home Covid tests since her symptoms started.     Past Medical History:  Diagnosis Date   Allergy    Anxiety    Depression    GERD (gastroesophageal reflux disease)    Migraine     Past Surgical History:  Procedure Laterality Date   mercury posion from fillings     Schwanoma tumor resection     TONGUE BIOPSY      Family History  Problem Relation Age of Onset   Coronary artery disease Father        Age 1s   Heart disease Father    Coronary artery disease Mother        Age 1s   Cancer Mother    Cancer Maternal Grandmother        ovarian   Colon cancer Neg Hx    Colon polyps Neg Hx    Esophageal cancer Neg Hx    Rectal cancer Neg Hx    Stomach cancer Neg Hx     Social History   Socioeconomic History   Marital status: Single    Spouse name: Not on file   Number of children: Not on file   Years of education: Not on file   Highest education level: Not on file  Occupational History   Not on file  Tobacco Use   Smoking status: Former    Packs/day: 0.50    Types: Cigarettes    Quit date: 01/06/1976    Years since quitting: 45.5   Smokeless tobacco: Never  Substance and Sexual Activity   Alcohol use: Yes    Alcohol/week: 2.0 standard drinks    Types: 2 Glasses of wine per week    Comment: One drink a week   Drug use: No   Sexual activity: Yes    Birth control/protection:  Post-menopausal  Other Topics Concern   Not on file  Social History Narrative   Not on file   Social Determinants of Health   Financial Resource Strain: Not on file  Food Insecurity: Not on file  Transportation Needs: Not on file  Physical Activity: Not on file  Stress: Not on file  Social Connections: Not on file  Intimate Partner Violence: Not on file    Outpatient Medications Prior to Visit  Medication Sig Dispense Refill   ALPRAZolam (XANAX) 0.5 MG tablet Take 0.5-1 tablets (0.25-0.5 mg total) by mouth at bedtime as needed for anxiety. 90 tablet 1   Calcium Carbonate-Vit D-Min (GNP CALCIUM 1200 PO) Take by mouth.     cholecalciferol (VITAMIN D) 1000 units tablet Place 2,000 Units under the tongue daily.      escitalopram (LEXAPRO) 20 MG tablet TAKE ONE (1) TABLET EACH DAY 90 tablet 3   rizatriptan (MAXALT-MLT) 10 MG disintegrating tablet PLACE 1 TABLET ON TONGUE AND ALLOW TO DISSOLVE AS NEEDED FOR ONSET  OFHEADACHE 12 tablet 5   topiramate (TOPAMAX) 50 MG tablet Take 3 tablets (150 mg total) by mouth daily. 270 tablet 3   No facility-administered medications prior to visit.    No Known Allergies  Review of Systems As per HPI.     Objective:    Physical Exam Vitals and nursing note reviewed.  Constitutional:      General: She is not in acute distress.    Appearance: She is not ill-appearing, toxic-appearing or diaphoretic.  HENT:     Head: Normocephalic and atraumatic.     Right Ear: Tympanic membrane, ear canal and external ear normal.     Left Ear: Tympanic membrane, ear canal and external ear normal.     Nose: Nose normal.     Mouth/Throat:     Mouth: Mucous membranes are moist.     Pharynx: Oropharynx is clear.  Cardiovascular:     Rate and Rhythm: Normal rate and regular rhythm.     Heart sounds: Normal heart sounds. No murmur heard. Pulmonary:     Effort: Pulmonary effort is normal. No respiratory distress.     Breath sounds: Normal breath sounds. No  wheezing, rhonchi or rales.  Chest:     Chest wall: No tenderness.  Musculoskeletal:     Right lower leg: No edema.     Left lower leg: No edema.  Skin:    General: Skin is warm and dry.  Neurological:     General: No focal deficit present.     Mental Status: She is alert and oriented to person, place, and time.  Psychiatric:        Mood and Affect: Mood normal.        Behavior: Behavior normal.    BP 131/66   Pulse 73   Temp (!) 97.2 F (36.2 C) (Temporal)   SpO2 100%  Wt Readings from Last 3 Encounters:  01/21/21 124 lb 9.6 oz (56.5 kg)  02/21/20 123 lb (55.8 kg)  09/09/18 120 lb (54.4 kg)    Health Maintenance Due  Topic Date Due   Zoster Vaccines- Shingrix (1 of 2) Never done   PAP SMEAR-Modifier  09/21/2020   INFLUENZA VACCINE  05/05/2021   COVID-19 Vaccine (5 - Booster for Pfizer series) 07/14/2021    There are no preventive care reminders to display for this patient.   Lab Results  Component Value Date   TSH 3.270 12/30/2020   Lab Results  Component Value Date   WBC 5.8 12/30/2020   HGB 14.9 12/30/2020   HCT 45.3 12/30/2020   MCV 88 12/30/2020   PLT 197 12/30/2020   Lab Results  Component Value Date   NA 139 12/30/2020   K 4.2 12/30/2020   CO2 20 12/30/2020   GLUCOSE 93 12/30/2020   BUN 13 12/30/2020   CREATININE 0.90 12/30/2020   BILITOT 0.5 12/30/2020   ALKPHOS 82 12/30/2020   AST 21 12/30/2020   ALT 14 12/30/2020   PROT 6.8 12/30/2020   ALBUMIN 4.5 12/30/2020   CALCIUM 9.5 12/30/2020   EGFR 72 12/30/2020   Lab Results  Component Value Date   CHOL 203 (H) 12/30/2020   Lab Results  Component Value Date   HDL 66 12/30/2020   Lab Results  Component Value Date   LDLCALC 124 (H) 12/30/2020   Lab Results  Component Value Date   TRIG 71 12/30/2020   Lab Results  Component Value Date   CHOLHDL 3.1 12/30/2020   No results found for:  HGBA1C     Assessment & Plan:   Onda was seen today for cough and nasal  congestion.  Diagnoses and all orders for this visit:  Acute cough Steroid burst as below. Tessalon perles prn. Discussed symptomatic care and return precautions.  -     benzonatate (TESSALON PERLES) 100 MG capsule; Take 1 capsule (100 mg total) by mouth 3 (three) times daily as needed for cough. -     predniSONE (DELTASONE) 20 MG tablet; Take 1 tablet (20 mg total) by mouth daily with breakfast for 5 days.  Return to office for new or worsening symptoms, or if symptoms persist.   The patient indicates understanding of these issues and agrees with the plan.  Gwenlyn Perking, FNP

## 2021-07-17 ENCOUNTER — Encounter: Payer: Self-pay | Admitting: Family Medicine

## 2021-07-24 ENCOUNTER — Other Ambulatory Visit: Payer: Self-pay | Admitting: Family Medicine

## 2021-07-24 ENCOUNTER — Encounter: Payer: Self-pay | Admitting: Family Medicine

## 2021-07-24 DIAGNOSIS — R051 Acute cough: Secondary | ICD-10-CM

## 2021-07-24 MED ORDER — PSEUDOEPH-BROMPHEN-DM 30-2-10 MG/5ML PO SYRP: 5.0000 mL | ORAL_SOLUTION | Freq: Four times a day (QID) | ORAL | 0 refills | Status: DC | PRN

## 2021-07-25 ENCOUNTER — Encounter: Payer: Self-pay | Admitting: Family Medicine

## 2021-07-28 ENCOUNTER — Encounter: Payer: Self-pay | Admitting: Family Medicine

## 2021-07-28 ENCOUNTER — Ambulatory Visit: Payer: BC Managed Care – PPO | Admitting: Family Medicine

## 2021-07-28 ENCOUNTER — Other Ambulatory Visit: Payer: Self-pay

## 2021-07-28 VITALS — BP 130/75 | HR 93 | Temp 97.7°F | Ht 61.0 in | Wt 116.0 lb

## 2021-07-28 DIAGNOSIS — F410 Panic disorder [episodic paroxysmal anxiety] without agoraphobia: Secondary | ICD-10-CM | POA: Diagnosis not present

## 2021-07-28 DIAGNOSIS — J208 Acute bronchitis due to other specified organisms: Secondary | ICD-10-CM | POA: Diagnosis not present

## 2021-07-28 DIAGNOSIS — F411 Generalized anxiety disorder: Secondary | ICD-10-CM | POA: Diagnosis not present

## 2021-07-28 DIAGNOSIS — B9689 Other specified bacterial agents as the cause of diseases classified elsewhere: Secondary | ICD-10-CM | POA: Diagnosis not present

## 2021-07-28 MED ORDER — AZITHROMYCIN 250 MG PO TABS: ORAL_TABLET | ORAL | 0 refills | Status: DC

## 2021-07-28 MED ORDER — ALPRAZOLAM 0.5 MG PO TABS: 0.2500 mg | ORAL_TABLET | Freq: Every evening | ORAL | 1 refills | Status: DC | PRN

## 2021-07-28 NOTE — Progress Notes (Signed)
Subjective: CC:GAD PCP: Janora Norlander, DO [id number] Karen White is a 65 y.o. female presenting to clinic today for:  1. GAD Reports stability of her anxiety disorder with as needed use of Xanax for bedtime.  She unfortunately has been unsuccessful in getting to sleep without this medication.  She denies any excessive sedation from it but simply that it relaxes her enough to go to bed.  She is compliant with her Lexapro 20 mg daily as well.  No reports of memory changes, mood disturbance or respiratory issues with the medication.  2.  Cough Patient has been seen for acute cough.  She has been treated with OTC medications, Tessalon Perles.  Symptoms were thought to be viral but she notes that symptoms have been ongoing and have been present for 3 weeks now.  No hemoptysis, fevers or chest pain reported.  No shortness of breath or wheezing reported.   ROS: Per HPI  No Known Allergies Past Medical History:  Diagnosis Date   Allergy    Anxiety    Depression    GERD (gastroesophageal reflux disease)    Migraine     Current Outpatient Medications:    ALPRAZolam (XANAX) 0.5 MG tablet, Take 0.5-1 tablets (0.25-0.5 mg total) by mouth at bedtime as needed for anxiety., Disp: 90 tablet, Rfl: 1   Calcium Carbonate-Vit D-Min (GNP CALCIUM 1200 PO), Take by mouth., Disp: , Rfl:    cholecalciferol (VITAMIN D) 1000 units tablet, Place 2,000 Units under the tongue daily. , Disp: , Rfl:    escitalopram (LEXAPRO) 20 MG tablet, TAKE ONE (1) TABLET EACH DAY, Disp: 90 tablet, Rfl: 3   rizatriptan (MAXALT-MLT) 10 MG disintegrating tablet, PLACE 1 TABLET ON TONGUE AND ALLOW TO DISSOLVE AS NEEDED FOR ONSET OFHEADACHE, Disp: 12 tablet, Rfl: 5   topiramate (TOPAMAX) 50 MG tablet, Take 3 tablets (150 mg total) by mouth daily., Disp: 270 tablet, Rfl: 3 Social History   Socioeconomic History   Marital status: Single    Spouse name: Not on file   Number of children: Not on file   Years of education: Not  on file   Highest education level: Not on file  Occupational History   Not on file  Tobacco Use   Smoking status: Former    Packs/day: 0.50    Types: Cigarettes    Quit date: 01/06/1976    Years since quitting: 45.5   Smokeless tobacco: Never  Substance and Sexual Activity   Alcohol use: Yes    Alcohol/week: 2.0 standard drinks    Types: 2 Glasses of wine per week    Comment: One drink a week   Drug use: No   Sexual activity: Yes    Birth control/protection: Post-menopausal  Other Topics Concern   Not on file  Social History Narrative   Not on file   Social Determinants of Health   Financial Resource Strain: Not on file  Food Insecurity: Not on file  Transportation Needs: Not on file  Physical Activity: Not on file  Stress: Not on file  Social Connections: Not on file  Intimate Partner Violence: Not on file   Family History  Problem Relation Age of Onset   Coronary artery disease Father        Age 74s   Heart disease Father    Coronary artery disease Mother        Age 35s   Cancer Mother    Cancer Maternal Grandmother        ovarian  Colon cancer Neg Hx    Colon polyps Neg Hx    Esophageal cancer Neg Hx    Rectal cancer Neg Hx    Stomach cancer Neg Hx     Objective: Office vital signs reviewed. Temp 97.7 F (36.5 C)   Ht 5\' 1"  (1.549 m)   Wt 116 lb (52.6 kg)   BMI 21.92 kg/m   Physical Examination:  General: Awake, alert, well nourished, No acute distress HEENT: Normal; sclera white Cardio: regular rate and rhythm, S1S2 heard, no murmurs appreciated Pulm: clear to auscultation bilaterally, no wheezes, rhonchi or rales; normal work of breathing on room air; coughing intermittently Psych: Mood stable, speech normal, affect appropriate.  Patient is pleasant and interactive  Depression screen [facility] 2/9 07/28/2021 07/28/2021 01/21/2021  Decreased Interest 0 0 0  Down, Depressed, Hopeless 0 0 0  PHQ - 2 Score 0 0 0  Altered sleeping - - -  Tired,  decreased energy - - -  Change in appetite - - -  Feeling bad or failure about yourself  - - -  Trouble concentrating - - -  Moving slowly or fidgety/restless - - -  Suicidal thoughts - - -  PHQ-9 Score - - -   GAD 7 : Generalized Anxiety Score 07/28/2021 01/21/2021 02/21/2020  Nervous, Anxious, on Edge 0 0 0  Control/stop worrying 0 0 0  Worry too much - different things 0 0 0  Trouble relaxing 0 0 0  Restless 0 0 2  Easily annoyed or irritable 0 0 0  Afraid - awful might happen 0 0 0  Total GAD 7 Score 0 0 2  Anxiety Difficulty Not difficult at all Not difficult at all Not difficult at all      Assessment/ Plan: 65 y.o. female   Generalized anxiety disorder with panic attacks - Plan: ALPRAZolam (XANAX) 0.5 MG tablet, CANCELED: ToxASSURE Select 13 (MW), Urine  Acute bacterial bronchitis - Plan: azithromycin (ZITHROMAX) 250 MG tablet  Benzodiazepine has been renewed.  No red flag signs or symptoms.  National narcotic database reviewed and there were no red flags.  May follow-up in 6 months for renewal of this if needed  Going to empirically treat her with a Z-Pak for presumed bacterial infection.  She has been ill for greater than 3 weeks now and symptoms are refractory to conservative measures.  Coughing intermittently but exam was otherwise unremarkable  No orders of the defined types were placed in this encounter.  No orders of the defined types were placed in this encounter.    Janora Norlander, DO [place] [phone]

## 2021-11-24 ENCOUNTER — Telehealth: Payer: Self-pay | Admitting: Family Medicine

## 2021-11-24 ENCOUNTER — Other Ambulatory Visit: Payer: Self-pay

## 2021-11-24 DIAGNOSIS — E7800 Pure hypercholesterolemia, unspecified: Secondary | ICD-10-CM

## 2021-11-24 NOTE — Telephone Encounter (Signed)
Future orders placed  Left message informing pt

## 2021-12-22 ENCOUNTER — Other Ambulatory Visit: Payer: BC Managed Care – PPO

## 2021-12-22 DIAGNOSIS — E7800 Pure hypercholesterolemia, unspecified: Secondary | ICD-10-CM

## 2021-12-23 LAB — CBC WITH DIFFERENTIAL/PLATELET
Basophils Absolute: 0 10*3/uL (ref 0.0–0.2)
Basos: 0 %
EOS (ABSOLUTE): 0.2 10*3/uL (ref 0.0–0.4)
Eos: 4 %
Hematocrit: 43.8 % (ref 34.0–46.6)
Hemoglobin: 14.1 g/dL (ref 11.1–15.9)
Immature Grans (Abs): 0 10*3/uL (ref 0.0–0.1)
Immature Granulocytes: 0 %
Lymphocytes Absolute: 2.3 10*3/uL (ref 0.7–3.1)
Lymphs: 45 %
MCH: 27.9 pg (ref 26.6–33.0)
MCHC: 32.2 g/dL (ref 31.5–35.7)
MCV: 87 fL (ref 79–97)
Monocytes Absolute: 0.4 10*3/uL (ref 0.1–0.9)
Monocytes: 9 %
Neutrophils Absolute: 2.2 10*3/uL (ref 1.4–7.0)
Neutrophils: 42 %
Platelets: 214 10*3/uL (ref 150–450)
RBC: 5.06 x10E6/uL (ref 3.77–5.28)
RDW: 13.1 % (ref 11.7–15.4)
WBC: 5.2 10*3/uL (ref 3.4–10.8)

## 2021-12-23 LAB — CMP14+EGFR
ALT: 13 IU/L (ref 0–32)
AST: 16 IU/L (ref 0–40)
Albumin/Globulin Ratio: 2 (ref 1.2–2.2)
Albumin: 4.3 g/dL (ref 3.8–4.8)
Alkaline Phosphatase: 103 IU/L (ref 44–121)
BUN/Creatinine Ratio: 14 (ref 12–28)
BUN: 14 mg/dL (ref 8–27)
Bilirubin Total: 0.5 mg/dL (ref 0.0–1.2)
CO2: 23 mmol/L (ref 20–29)
Calcium: 9.6 mg/dL (ref 8.7–10.3)
Chloride: 106 mmol/L (ref 96–106)
Creatinine, Ser: 1 mg/dL (ref 0.57–1.00)
Globulin, Total: 2.1 g/dL (ref 1.5–4.5)
Glucose: 100 mg/dL — ABNORMAL HIGH (ref 70–99)
Potassium: 4 mmol/L (ref 3.5–5.2)
Sodium: 140 mmol/L (ref 134–144)
Total Protein: 6.4 g/dL (ref 6.0–8.5)
eGFR: 63 mL/min/{1.73_m2} (ref >59)

## 2021-12-23 LAB — TSH: TSH: 2.93 u[IU]/mL (ref 0.450–4.500)

## 2021-12-23 LAB — LIPID PANEL
Chol/HDL Ratio: 3.5 ratio (ref 0.0–4.4)
Cholesterol, Total: 192 mg/dL (ref 100–199)
HDL: 55 mg/dL (ref >39)
LDL Chol Calc (NIH): 124 mg/dL — ABNORMAL HIGH (ref 0–99)
Triglycerides: 68 mg/dL (ref 0–149)
VLDL Cholesterol Cal: 13 mg/dL (ref 5–40)

## 2021-12-29 ENCOUNTER — Ambulatory Visit: Payer: BC Managed Care – PPO | Admitting: Family Medicine

## 2021-12-29 ENCOUNTER — Encounter: Payer: Self-pay | Admitting: Family Medicine

## 2021-12-29 VITALS — BP 125/71 | HR 75 | Temp 98.4°F | Ht 61.0 in | Wt 122.0 lb

## 2021-12-29 DIAGNOSIS — F411 Generalized anxiety disorder: Secondary | ICD-10-CM | POA: Diagnosis not present

## 2021-12-29 DIAGNOSIS — E7800 Pure hypercholesterolemia, unspecified: Secondary | ICD-10-CM | POA: Diagnosis not present

## 2021-12-29 DIAGNOSIS — Z0000 Encounter for general adult medical examination without abnormal findings: Secondary | ICD-10-CM

## 2021-12-29 DIAGNOSIS — G43909 Migraine, unspecified, not intractable, without status migrainosus: Secondary | ICD-10-CM

## 2021-12-29 DIAGNOSIS — Z0001 Encounter for general adult medical examination with abnormal findings: Secondary | ICD-10-CM

## 2021-12-29 DIAGNOSIS — F410 Panic disorder [episodic paroxysmal anxiety] without agoraphobia: Secondary | ICD-10-CM

## 2021-12-29 MED ORDER — ESCITALOPRAM OXALATE 20 MG PO TABS: ORAL_TABLET | ORAL | 3 refills | Status: DC

## 2021-12-29 MED ORDER — ALPRAZOLAM 0.5 MG PO TABS: 0.2500 mg | ORAL_TABLET | Freq: Every evening | ORAL | 1 refills | Status: DC | PRN

## 2021-12-29 MED ORDER — TOPIRAMATE 50 MG PO TABS: 150.0000 mg | ORAL_TABLET | Freq: Every day | ORAL | 3 refills | Status: DC

## 2021-12-29 NOTE — Progress Notes (Signed)
? ?Karen White is a 66 y.o. female presents to office today for annual physical exam examination.   ? ?Concerns today include: ?1. none ? ?Occupation: retired, Marital status: single, Substance use: none ?Diet: balanced, for the most part. ?Last eye exam:  UTD, new glasses ?Last dental exam: UTD ?Last colonoscopy: UTD ?Last mammogram: UTD ?Last pap smear: UTD ?Refills needed today: all ?Immunizations needed: ?Immunization History  ?Administered Date(s) Administered  ? Influenza-Unspecified 08/06/2020, 08/25/2020  ? PFIZER(Purple Top)SARS-COV-2 Vaccination 12/03/2019, 12/30/2019, 08/01/2020  ? Tdap 04/19/2014  ? Unspecified SARS-COV-2 Vaccination 03/14/2021  ? ? ? ?Past Medical History:  ?Diagnosis Date  ? Allergy   ? Anxiety   ? Depression   ? GERD (gastroesophageal reflux disease)   ? Migraine   ? ?Social History  ? ?Socioeconomic History  ? Marital status: Single  ?  Spouse name: Not on file  ? Number of children: Not on file  ? Years of education: Not on file  ? Highest education level: Not on file  ?Occupational History  ? Not on file  ?Tobacco Use  ? Smoking status: Former  ?  Packs/day: 0.50  ?  Types: Cigarettes  ?  Quit date: 01/06/1976  ?  Years since quitting: 46.0  ? Smokeless tobacco: Never  ?Substance and Sexual Activity  ? Alcohol use: Yes  ?  Alcohol/week: 2.0 standard drinks  ?  Types: 2 Glasses of wine per week  ?  Comment: One drink a week  ? Drug use: No  ? Sexual activity: Yes  ?  Birth control/protection: Post-menopausal  ?Other Topics Concern  ? Not on file  ?Social History Narrative  ? Not on file  ? ?Social Determinants of Health  ? ?Financial Resource Strain: Not on file  ?Food Insecurity: Not on file  ?Transportation Needs: Not on file  ?Physical Activity: Not on file  ?Stress: Not on file  ?Social Connections: Not on file  ?Intimate Partner Violence: Not on file  ? ?Past Surgical History:  ?Procedure Laterality Date  ? mercury posion from fillings    ? Schwanoma tumor resection    ?  TONGUE BIOPSY    ? ?Family History  ?Problem Relation Age of Onset  ? Coronary artery disease Father   ?     Age 48s  ? Heart disease Father   ? Coronary artery disease Mother   ?     Age 33s  ? Cancer Mother   ? Cancer Maternal Grandmother   ?     ovarian  ? Colon cancer Neg Hx   ? Colon polyps Neg Hx   ? Esophageal cancer Neg Hx   ? Rectal cancer Neg Hx   ? Stomach cancer Neg Hx   ? ? ?Current Outpatient Medications:  ?  ALPRAZolam (XANAX) 0.5 MG tablet, Take 0.5-1 tablets (0.25-0.5 mg total) by mouth at bedtime as needed for anxiety., Disp: 90 tablet, Rfl: 1 ?  Calcium Carbonate-Vit D-Min (GNP CALCIUM 1200 PO), Take by mouth., Disp: , Rfl:  ?  cholecalciferol (VITAMIN D) 1000 units tablet, Place 2,000 Units under the tongue daily. , Disp: , Rfl:  ?  escitalopram (LEXAPRO) 20 MG tablet, TAKE ONE (1) TABLET EACH DAY, Disp: 90 tablet, Rfl: 3 ?  rizatriptan (MAXALT-MLT) 10 MG disintegrating tablet, PLACE 1 TABLET ON TONGUE AND ALLOW TO DISSOLVE AS NEEDED FOR ONSET OFHEADACHE, Disp: 12 tablet, Rfl: 5 ?  topiramate (TOPAMAX) 50 MG tablet, Take 3 tablets (150 mg total) by mouth daily., Disp: 270  tablet, Rfl: 3 ? ?No Known Allergies  ? ?ROS: ?Review of Systems ?A comprehensive review of systems was negative except for: Eyes: positive for contacts/glasses   ? ?Physical exam ?BP 125/71   Pulse 75   Temp 98.4 ?F (36.9 ?C)   Ht '5\' 1"'$  (1.549 m)   Wt 122 lb (55.3 kg)   SpO2 99%   BMI 23.05 kg/m?  ?General appearance: alert, cooperative, appears stated age, and no distress ?Head: Normocephalic, without obvious abnormality, atraumatic ?Eyes: negative findings: lids and lashes normal, conjunctivae and sclerae normal, corneas clear, and pupils equal, round, reactive to light and accomodation ?Ears: normal TM's and external ear canals both ears ?Nose: Nares normal. Septum midline. Mucosa normal. No drainage or sinus tenderness. ?Throat: lips, mucosa, and tongue normal; teeth and gums normal ?Neck: no adenopathy, no carotid  bruit, supple, symmetrical, trachea midline, and thyroid not enlarged, symmetric, no tenderness/mass/nodules ?Back: symmetric, no curvature. ROM normal. No CVA tenderness. ?Lungs: clear to auscultation bilaterally ?Heart: regular rate and rhythm, S1, S2 normal, no murmur, click, rub or gallop ?Abdomen: soft, non-tender; bowel sounds normal; no masses,  no organomegaly ?Extremities: extremities normal, atraumatic, no cyanosis or edema ?Pulses: 2+ and symmetric ?Skin: Skin color, texture, turgor normal. No rashes or lesions ?Lymph nodes: Cervical, supraclavicular, and axillary nodes normal. ?Neurologic: Alert and oriented X 3, normal strength and tone. Normal symmetric reflexes. Normal coordination and gait ? ?  12/29/2021  ?  9:40 AM 07/28/2021  ?  9:04 AM 07/28/2021  ?  9:02 AM  ?Depression screen PHQ 2/9  ?Decreased Interest 0 0 0  ?Down, Depressed, Hopeless 0 0 0  ?PHQ - 2 Score 0 0 0  ? ? ?  12/29/2021  ?  9:40 AM 07/28/2021  ?  9:02 AM 01/21/2021  ? 11:00 AM 02/21/2020  ? 11:27 AM  ?GAD 7 : Generalized Anxiety Score  ?Nervous, Anxious, on Edge 0 0 0 0  ?Control/stop worrying 0 0 0 0  ?Worry too much - different things 0 0 0 0  ?Trouble relaxing 0 0 0 0  ?Restless 0 0 0 2  ?Easily annoyed or irritable 0 0 0 0  ?Afraid - awful might happen 0 0 0 0  ?Total GAD 7 Score 0 0 0 2  ?Anxiety Difficulty Not difficult at all Not difficult at all Not difficult at all Not difficult at all  ? ? ?  ? ? ?Assessment/ Plan: ?Karen White for annual physical exam.  ? ?Annual physical exam ? ?Pure hypercholesterolemia ? ?Generalized anxiety disorder with panic attacks - Plan: ToxASSURE Select 13 (MW), Urine, ALPRAZolam (XANAX) 0.5 MG tablet, escitalopram (LEXAPRO) 20 MG tablet ? ?Migraine without status migrainosus, not intractable, unspecified migraine type - Plan: topiramate (TOPAMAX) 50 MG tablet ? ?Due for shingles vaccination, otherwise up-to-date on preventative health care. ? ?Cholesterol levels did show a slight rise in  the LDL but ASCVD risk score was still below 7.5% so no medications have been initiated ? ?Anxiety disorder is chronic and stable.  UDS and CSC were updated as per office policy today.  However, insufficient urine was collected for UDS I suspect we will need to repeat that at her next visit.  Lexapro renewed national narcotic database reviewed and there were no red flags ? ?Migraines are chronic and stable.  Topamax renewed ? ?Counseled on healthy lifestyle choices, including diet (rich in fruits, vegetables and lean meats and low in salt and simple carbohydrates) and exercise (at least 30 minutes  of moderate physical activity daily). ? ?Patient to follow up in 1 year for annual exam or sooner if needed. ? ?Claudean Leavelle M. Satori Krabill, DO ? ? ? ? ? ? ? ?

## 2022-01-02 LAB — TOXASSURE SELECT 13 (MW), URINE

## 2022-04-04 ENCOUNTER — Other Ambulatory Visit: Payer: Self-pay | Admitting: Family Medicine

## 2022-04-04 DIAGNOSIS — F411 Generalized anxiety disorder: Secondary | ICD-10-CM

## 2022-05-27 ENCOUNTER — Encounter: Payer: Self-pay | Admitting: Family Medicine

## 2022-05-27 ENCOUNTER — Ambulatory Visit (INDEPENDENT_AMBULATORY_CARE_PROVIDER_SITE_OTHER): Payer: BC Managed Care – PPO | Admitting: Family Medicine

## 2022-05-27 ENCOUNTER — Ambulatory Visit (INDEPENDENT_AMBULATORY_CARE_PROVIDER_SITE_OTHER): Payer: BC Managed Care – PPO

## 2022-05-27 VITALS — BP 135/76 | HR 78 | Temp 97.9°F | Resp 20 | Ht 61.0 in | Wt 127.0 lb

## 2022-05-27 DIAGNOSIS — K120 Recurrent oral aphthae: Secondary | ICD-10-CM

## 2022-05-27 DIAGNOSIS — T7840XA Allergy, unspecified, initial encounter: Secondary | ICD-10-CM | POA: Diagnosis not present

## 2022-05-27 DIAGNOSIS — R058 Other specified cough: Secondary | ICD-10-CM | POA: Diagnosis not present

## 2022-05-27 MED ORDER — EPINEPHRINE 0.3 MG/0.3ML IJ SOAJ: 0.3000 mg | INTRAMUSCULAR | 0 refills | Status: DC | PRN

## 2022-05-27 MED ORDER — PREDNISONE 20 MG PO TABS: ORAL_TABLET | ORAL | 0 refills | Status: DC

## 2022-05-27 MED ORDER — METHYLPREDNISOLONE ACETATE 80 MG/ML IJ SUSP
80.0000 mg | Freq: Once | INTRAMUSCULAR | Status: AC
  Administered 2022-05-27: 80 mg via INTRAMUSCULAR

## 2022-05-27 MED ORDER — FAMOTIDINE 20 MG PO TABS: 20.0000 mg | ORAL_TABLET | Freq: Two times a day (BID) | ORAL | 0 refills | Status: DC

## 2022-05-27 MED ORDER — HYDROXYZINE HCL 25 MG PO TABS: 12.5000 mg | ORAL_TABLET | Freq: Three times a day (TID) | ORAL | 0 refills | Status: DC | PRN

## 2022-05-27 NOTE — Progress Notes (Signed)
Subjective: CC: Allergic reaction PCP: Janora Norlander, DO WYO:VZCHY K Malinowski is a 66 y.o. female presenting to clinic today for:  1.  Alert reaction Patient reports that she started developing allergic reaction a couple of weeks ago prior to her going to [place].   She notes that she had some swelling of her tongue last night but primarily she has had hives and these been ongoing for about 4 weeks now.  She has had similar breakout in the past but its been over 8 years and it was directly related to heavy metals related to her fillings. These were extracted.  She underwent chelation in [place].  Mercury levels improved and she had not had any issues since.  She has been utilizing Zyrtec and as needed Benadryl but the Benadryl causes some excessive daytime sedation.  She has an EpiPen on hand but it is expired.  She also reports some coughing that has been ongoing for several months now.  She practices very clean lifestyle and monitors exactly what she eats very closely.  She gave up caffeine containing products because she was worried that perhaps the cough was induced by GERD.  However, the symptoms have not improved.  Does not report any significant drainage.  No fevers.  No hemoptysis.  No unplanned weight loss.  She does not smoke.   ROS: Per HPI  No Known Allergies Past Medical History:  Diagnosis Date   Allergy    Anxiety    Depression    GERD (gastroesophageal reflux disease)    Migraine     Current Outpatient Medications:    ALPRAZolam (XANAX) 0.5 MG tablet, Take 0.5-1 tablets (0.25-0.5 mg total) by mouth at bedtime as needed for anxiety., Disp: 90 tablet, Rfl: 1   Calcium Carbonate-Vit D-Min (GNP CALCIUM 1200 PO), Take by mouth., Disp: , Rfl:    cholecalciferol (VITAMIN D) 1000 units tablet, Place 2,000 Units under the tongue daily. , Disp: , Rfl:    escitalopram (LEXAPRO) 20 MG tablet, TAKE ONE (1) TABLET EACH DAY, Disp: 90 tablet, Rfl: 3   rizatriptan (MAXALT-MLT) 10  MG disintegrating tablet, PLACE 1 TABLET ON TONGUE AND ALLOW TO DISSOLVE AS NEEDED FOR ONSET OFHEADACHE, Disp: 12 tablet, Rfl: 5   topiramate (TOPAMAX) 50 MG tablet, Take 3 tablets (150 mg total) by mouth daily., Disp: 270 tablet, Rfl: 3 Social History   Socioeconomic History   Marital status: Single    Spouse name: Not on file   Number of children: Not on file   Years of education: Not on file   Highest education level: Not on file  Occupational History   Not on file  Tobacco Use   Smoking status: Former    Packs/day: 0.50    Types: Cigarettes    Quit date: 01/06/1976    Years since quitting: 46.4   Smokeless tobacco: Never  Substance and Sexual Activity   Alcohol use: Yes    Alcohol/week: 2.0 standard drinks of alcohol    Types: 2 Glasses of wine per week    Comment: One drink a week   Drug use: No   Sexual activity: Yes    Birth control/protection: Post-menopausal  Other Topics Concern   Not on file  Social History Narrative   Not on file   Social Determinants of Health   Financial Resource Strain: Not on file  Food Insecurity: Not on file  Transportation Needs: Not on file  Physical Activity: Not on file  Stress: Not on file  Social Connections: Not on file  Intimate Partner Violence: Not on file   Family History  Problem Relation Age of Onset   Coronary artery disease Father        Age 80s   Heart disease Father    Coronary artery disease Mother        Age 41s   Cancer Mother    Cancer Maternal Grandmother        ovarian   Colon cancer Neg Hx    Colon polyps Neg Hx    Esophageal cancer Neg Hx    Rectal cancer Neg Hx    Stomach cancer Neg Hx     Objective: Office vital signs reviewed. BP 135/76   Pulse 78   Temp 97.9 F (36.6 C)   Resp 20   Ht '5\' 1"'$  (1.549 m)   Wt 127 lb (57.6 kg)   SpO2 100%   BMI 24.00 kg/m   Physical Examination:  General: Awake, alert, well nourished, No acute distress HEENT: No mucosal edema appreciated.  She has an  aphthous ulcer noted along the left lateral tongue Cardio: regular rate and rhythm, S1S2 heard, no murmurs appreciated Pulm: clear to auscultation bilaterally, no wheezes, rhonchi or rales; normal work of breathing on room air  Assessment/ Plan: 66 y.o. female   Allergic reaction, initial encounter - Plan: methylPREDNISolone acetate (DEPO-MEDROL) injection 80 mg, predniSONE (DELTASONE) 20 MG tablet, hydrOXYzine (ATARAX) 25 MG tablet, famotidine (PEPCID) 20 MG tablet, EPINEPHrine (EPIPEN 2-PAK) 0.3 mg/0.3 mL IJ SOAJ injection, CBC with Differential, Alpha-Gal Panel  Cough present for greater than 3 weeks - Plan: DG Chest 2 View  Aphthous ulcer of tongue  Uncertain trigger.  She unfortunately had quite a severe reaction in the past due to heavy metal/mercury which required chelation.  I am going to treat her for the acute allergic reaction but if this continues or recurs discussed low threshold to have her evaluated by allergy.  Will obtain CBC with differential as well as alpha gal panel to further evaluate for any possible etiologies.  We will obtain chest x-ray given cough that is been present for couple of months now without specific cause.  Her lung exam was totally unremarkable.  Differential diagnosis includes allergy induced cough/eosinophilic cough versus GERD induced  Aphthous ulcer incidentally noted on exam.  Prednisone should help with this  No orders of the defined types were placed in this encounter.  No orders of the defined types were placed in this encounter.    Janora Norlander, DO [place] [phone]

## 2022-05-27 NOTE — Patient Instructions (Signed)
How to Use an Auto-Injector Pen An auto-injector pen (pre-filled automatic epinephrine injection device) is a device that is used to deliver epinephrine to the body. Epinephrine is a medicine that is given as a shot (injection). It works by relaxing the muscles in the airways and tightening the blood vessels. It is used to treat: A life-threatening allergic reaction (anaphylaxis). Serious breathing problems, such as severe asthma attacks, some lung problems, and other emergency conditions. An epinephrine injection can save your life. You should always carry an auto-injector pen with you if you are at risk for severe asthma attacks or anaphylaxis. You may hear other names for an auto-injector pen. They are epinephrine injection, epinephrine auto-injector pen, epinephrine pen, and automatic injection device. What are the risks? Using the auto-injector pen is safe. However, problems may arise, including: Damage to bone or tissue. Make sure that you correctly place the needle in the muscle of your outer thigh as told by your health care provider. When should I use my auto-injector pen? Use your auto-injector pen as soon as you think you are experiencing anaphylaxis or a severe asthma attack. Anaphylaxis is very dangerous if it is not treated right away. Signs and symptoms of anaphylaxis may include: Feeling warm in the face (flushed). This may include redness. Itchy, red, swollen areas of skin (hives). Swelling of the eyes, lips, face, mouth, tongue, or throat. Difficulty breathing, speaking, or swallowing. Noisy breathing (wheezing). Dizziness, light-headedness, or fainting. Pain or cramping in the abdomen. Vomiting or diarrhea. These symptoms may represent a serious problem that is an emergency. Do not wait to see if the symptoms will go away. Use your auto-injector pen as you have been instructed, and get medical help right away. Call your local emergency services (911 in the [place] Do not drive  yourself to the hospital. General tips for using an auto-injector pen  Use epinephrine exactly as told by your health care provider. Do not inject it more often or in greater or smaller doses than your health care provider told you. Most auto-injector pens contain one dose of epinephrine. Some contain two doses. Sit or lie down before giving yourself an injection or before receiving an injection from someone else. Use the auto-injector pen to give yourself an injection under your skin or into your muscle on the outer side of your thigh. Do not give yourself an injection into your buttocks or any other part of your body. In an emergency, you can use your auto-injector pen through your clothing. After you inject a dose of epinephrine, some liquid may remain in your auto-injector pen. This is normal. Replace your epinephrine immediately after you use your auto-injector pen. This is important if you have another reaction. If possible, carry two epinephrine auto-injector pens. If you need to give yourself a second dose of epinephrine, give the second injection in another area on your outer thigh. Do not give two injections in exactly the same place on your body. This can lead to tissue damage. From time to time: Check the expiration date on your auto-injector pen. Check the solution to ensure that it is not cloudy and that there are no particles floating in it. If your auto-injector pen is expired or if the solution is cloudy or has particles floating in it, throw it away and get a new one. Ask your health care provider how to safely get rid of used or expired auto-injector pens. Talk with your pharmacist or health care provider if you have questions about how  to inject epinephrine correctly. Get help right away if: You inject epinephrine. If you use epinephrine, you must still get emergency medical treatment, even if the medicine seems to be working. You may need additional medical care, and you may  need to be monitored for the side effects of epinephrine. The side effects include: Fast or irregular heartbeat. Nervousness or anxiety with shaking that does not stop. Difficulty breathing. Sweating. Headache. Nausea or vomiting. Dizziness or weakness. Summary An auto-injector pen (pre-filled automatic epinephrine injection device) is a device that is used to deliver epinephrine to the body. An auto-injector pen is used to treat a life-threatening allergic reaction (anaphylaxis), asthma attack, or other emergency conditions. You should always carry an auto-injector pen with you if you are at risk for anaphylaxis or severe asthma attacks. Use of this device is safe. However, bone or tissue damage can occur if you do not follow instructions for injecting the medicine. Talk with your pharmacist or health care provider if you have questions about how to inject epinephrine correctly. This information is not intended to replace advice given to you by your health care provider. Make sure you discuss any questions you have with your health care provider. Document Revised: 02/04/2022 Document Reviewed: 11/08/2020 Elsevier Patient Education  [place].

## 2022-05-29 LAB — ALPHA-GAL PANEL
Allergen Lamb IgE: <0.1 kU/L
Beef IgE: <0.1 kU/L
IgE (Immunoglobulin E), Serum: 4 IU/mL — ABNORMAL LOW (ref 6–495)
O215-IgE Alpha-Gal: <0.1 kU/L
Pork IgE: <0.1 kU/L

## 2022-05-29 LAB — CBC WITH DIFFERENTIAL/PLATELET
Basophils Absolute: 0 10*3/uL (ref 0.0–0.2)
Basos: 0 %
EOS (ABSOLUTE): 0.1 10*3/uL (ref 0.0–0.4)
Eos: 1 %
Hematocrit: 46.7 % — ABNORMAL HIGH (ref 34.0–46.6)
Hemoglobin: 15.3 g/dL (ref 11.1–15.9)
Immature Grans (Abs): 0 10*3/uL (ref 0.0–0.1)
Immature Granulocytes: 0 %
Lymphocytes Absolute: 2.1 10*3/uL (ref 0.7–3.1)
Lymphs: 30 %
MCH: 29 pg (ref 26.6–33.0)
MCHC: 32.8 g/dL (ref 31.5–35.7)
MCV: 88 fL (ref 79–97)
Monocytes Absolute: 0.5 10*3/uL (ref 0.1–0.9)
Monocytes: 7 %
Neutrophils Absolute: 4.3 10*3/uL (ref 1.4–7.0)
Neutrophils: 62 %
Platelets: 233 10*3/uL (ref 150–450)
RBC: 5.28 x10E6/uL (ref 3.77–5.28)
RDW: 13 % (ref 11.7–15.4)
WBC: 6.9 10*3/uL (ref 3.4–10.8)

## 2022-07-13 ENCOUNTER — Other Ambulatory Visit: Payer: Self-pay | Admitting: Family Medicine

## 2022-07-13 DIAGNOSIS — F410 Panic disorder [episodic paroxysmal anxiety] without agoraphobia: Secondary | ICD-10-CM

## 2022-07-13 NOTE — Telephone Encounter (Signed)
Pt scheduled for 08/05/2022 Karen White

## 2022-07-21 ENCOUNTER — Encounter: Payer: Self-pay | Admitting: Family Medicine

## 2022-07-24 ENCOUNTER — Ambulatory Visit: Payer: BC Managed Care – PPO | Admitting: Family Medicine

## 2022-07-24 ENCOUNTER — Encounter: Payer: Self-pay | Admitting: Family Medicine

## 2022-07-24 VITALS — BP 139/81 | HR 79 | Temp 100.0°F | Ht 61.0 in | Wt 116.4 lb

## 2022-07-24 DIAGNOSIS — F410 Panic disorder [episodic paroxysmal anxiety] without agoraphobia: Secondary | ICD-10-CM | POA: Diagnosis not present

## 2022-07-24 DIAGNOSIS — G43909 Migraine, unspecified, not intractable, without status migrainosus: Secondary | ICD-10-CM | POA: Diagnosis not present

## 2022-07-24 DIAGNOSIS — F411 Generalized anxiety disorder: Secondary | ICD-10-CM

## 2022-07-24 DIAGNOSIS — Z77011 Contact with and (suspected) exposure to lead: Secondary | ICD-10-CM | POA: Diagnosis not present

## 2022-07-24 MED ORDER — ALPRAZOLAM 0.5 MG PO TABS: 0.2500 mg | ORAL_TABLET | Freq: Every evening | ORAL | 1 refills | Status: DC | PRN

## 2022-07-24 NOTE — Progress Notes (Signed)
Subjective: CC: Anxiety PCP: Janora Norlander, DO UVO:ZDGUY Karen White is a 66 y.o. female presenting to clinic today for:  1.  Insomnia associated with anxiety disorder Patient reports that when she tries to lay down at bedtime her mind races unless she takes half a tablet of Xanax.  She never really takes more than 0.25 mg.  Denies any memory changes, excessive daytime sedation, falls, visual or auditory hallucinations.  She is interested in maybe weaning from some medication.  2.  Migraine headaches Patient denies any recent migraine headaches in several years.  She would like to try Topamax wean.  Has Maxalt on hand for breakthrough migraine headaches  3.  Abnormal labs Patient notes that she had several labs collected by her integrative specialist after she started breaking out with hives again.  She has previously required chelation for heavy metal issues.  It was found that she had an elevated lead level and mildly elevated mercury level.  She is on oral chelation now.  She is having some fatigability towards the end of the day and she is not sure if this is due to the changes in supplements or other medications.  She plans on reaching out to her specialist soon   ROS: Per HPI  No Known Allergies Past Medical History:  Diagnosis Date   Allergy    Anxiety    Depression    GERD (gastroesophageal reflux disease)    Migraine     Current Outpatient Medications:    Acetylcysteine (NAC PO), Take by mouth., Disp: , Rfl:    ALPRAZolam (XANAX) 0.5 MG tablet, Take 0.5-1 tablets (0.25-0.5 mg total) by mouth at bedtime as needed for anxiety., Disp: 90 tablet, Rfl: 1   b complex vitamins capsule, Take 1 capsule by mouth daily., Disp: , Rfl:    EPINEPHrine (EPIPEN 2-PAK) 0.3 mg/0.3 mL IJ SOAJ injection, Inject 0.3 mg into the muscle as needed for anaphylaxis., Disp: 1 each, Rfl: 0   escitalopram (LEXAPRO) 20 MG tablet, TAKE ONE (1) TABLET EACH DAY, Disp: 90 tablet, Rfl: 3   hydrOXYzine  (ATARAX) 25 MG tablet, Take 0.5-1 tablets (12.5-25 mg total) by mouth every 8 (eight) hours as needed for itching., Disp: 30 tablet, Rfl: 0   Omega-3 Fatty Acids (OMEGA-3 1450 PO), Take by mouth., Disp: , Rfl:    rizatriptan (MAXALT-MLT) 10 MG disintegrating tablet, PLACE 1 TABLET ON TONGUE AND ALLOW TO DISSOLVE AS NEEDED FOR ONSET OFHEADACHE, Disp: 12 tablet, Rfl: 5   topiramate (TOPAMAX) 50 MG tablet, Take 3 tablets (150 mg total) by mouth daily., Disp: 270 tablet, Rfl: 3   Zinc Sulfate (ZINC 15 PO), Take by mouth., Disp: , Rfl:    famotidine (PEPCID) 20 MG tablet, Take 1 tablet (20 mg total) by mouth 2 (two) times daily for 7 days., Disp: 14 tablet, Rfl: 0 Social History   Socioeconomic History   Marital status: Single    Spouse name: Not on file   Number of children: Not on file   Years of education: Not on file   Highest education level: Not on file  Occupational History   Not on file  Tobacco Use   Smoking status: Former    Packs/day: 0.50    Types: Cigarettes    Quit date: 01/06/1976    Years since quitting: 46.5   Smokeless tobacco: Never  Substance and Sexual Activity   Alcohol use: Yes    Alcohol/week: 2.0 standard drinks of alcohol    Types: 2 Glasses  of wine per week    Comment: One drink a week   Drug use: No   Sexual activity: Yes    Birth control/protection: Post-menopausal  Other Topics Concern   Not on file  Social History Narrative   Not on file   Social Determinants of Health   Financial Resource Strain: Not on file  Food Insecurity: Not on file  Transportation Needs: Not on file  Physical Activity: Not on file  Stress: Not on file  Social Connections: Not on file  Intimate Partner Violence: Not on file   Family History  Problem Relation Age of Onset   Coronary artery disease Father        Age 17s   Heart disease Father    Coronary artery disease Mother        Age 54s   Cancer Mother    Cancer Maternal Grandmother        ovarian   Colon cancer  Neg Hx    Colon polyps Neg Hx    Esophageal cancer Neg Hx    Rectal cancer Neg Hx    Stomach cancer Neg Hx     Objective: Office vital signs reviewed. BP 139/81   Pulse 79   Temp 100 F (37.8 C)   Ht '5\' 1"'$  (1.549 m)   Wt 116 lb 6.4 oz (52.8 kg)   SpO2 100%   BMI 21.99 kg/m   Physical Examination:  General: Awake, alert, well nourished, No acute distress HEENT: sclera white, MMM Cardio: regular rate and rhythm, S1S2 heard, no murmurs appreciated Pulm: clear to auscultation bilaterally, no wheezes, rhonchi or rales; normal work of breathing on room air      07/24/2022   10:05 AM 05/27/2022   11:20 AM 12/29/2021    9:40 AM  Depression screen PHQ 2/9  Decreased Interest 0 0 0  Down, Depressed, Hopeless 0 0 0  PHQ - 2 Score 0 0 0      07/24/2022   10:05 AM 12/29/2021    9:40 AM 07/28/2021    9:02 AM 01/21/2021   11:00 AM  GAD 7 : Generalized Anxiety Score  Nervous, Anxious, on Edge 0 0 0 0  Control/stop worrying 0 0 0 0  Worry too much - different things 0 0 0 0  Trouble relaxing 0 0 0 0  Restless 0 0 0 0  Easily annoyed or irritable 0 0 0 0  Afraid - awful might happen 0 0 0 0  Total GAD 7 Score 0 0 0 0  Anxiety Difficulty Not difficult at all Not difficult at all Not difficult at all Not difficult at all     Assessment/ Plan: 66 y.o. female   Generalized anxiety disorder with panic attacks - Plan: ALPRAZolam (XANAX) 0.5 MG tablet  Migraine without status migrainosus, not intractable, unspecified migraine type  Lead exposure  Xanax renewed.  We discussed that there is a potential for increased anxiety if she weans off of Lexapro but I have given her the information on how to do this.  Up-to-date on UDS and CSC.  National narcotic database reviewed and there were no red flags  Asymptomatic from a migraine standpoint wishes to trial off of Topamax.  She will reduce by 1 tablet every 3 weeks  Currently under the care of integrative medicine for elevated lead  levels.  These labs have been scanned into her medical record  No orders of the defined types were placed in this encounter.  No orders  of the defined types were placed in this encounter.  Total time spent with patient 30 min.  Greater than 50% of encounter spent in coordination of care/counseling.   Janora Norlander, DO [place] [phone]

## 2022-07-24 NOTE — Patient Instructions (Signed)
Topamax: 2 tabs daily x3 weeks 1 tab daily x3 weeks 1/2 tab daily x3 weeks. Then can stop  Lexapro: 1/2 tablet daily x4 weeks 1/2 tablet every other day x4 weeks. Then stop

## 2022-07-28 ENCOUNTER — Ambulatory Visit: Payer: BC Managed Care – PPO | Admitting: Family Medicine

## 2022-08-05 ENCOUNTER — Ambulatory Visit: Payer: BC Managed Care – PPO | Admitting: Family Medicine

## 2022-09-30 LAB — LAB REPORT - SCANNED: Hemoglobin A1c: 5.6

## 2022-10-12 ENCOUNTER — Other Ambulatory Visit: Payer: Self-pay | Admitting: Family Medicine

## 2022-10-12 DIAGNOSIS — G43909 Migraine, unspecified, not intractable, without status migrainosus: Secondary | ICD-10-CM

## 2022-11-04 ENCOUNTER — Other Ambulatory Visit: Payer: Self-pay | Admitting: Family Medicine

## 2022-11-04 DIAGNOSIS — F410 Panic disorder [episodic paroxysmal anxiety] without agoraphobia: Secondary | ICD-10-CM

## 2022-12-16 ENCOUNTER — Ambulatory Visit: Payer: Medicare PPO | Admitting: Family Medicine

## 2022-12-16 ENCOUNTER — Encounter: Payer: Self-pay | Admitting: Family Medicine

## 2022-12-16 VITALS — BP 126/77 | HR 69 | Temp 98.7°F | Ht 61.0 in | Wt 114.0 lb

## 2022-12-16 DIAGNOSIS — F411 Generalized anxiety disorder: Secondary | ICD-10-CM | POA: Diagnosis not present

## 2022-12-16 DIAGNOSIS — G43909 Migraine, unspecified, not intractable, without status migrainosus: Secondary | ICD-10-CM | POA: Diagnosis not present

## 2022-12-16 DIAGNOSIS — Z79899 Other long term (current) drug therapy: Secondary | ICD-10-CM

## 2022-12-16 DIAGNOSIS — E7800 Pure hypercholesterolemia, unspecified: Secondary | ICD-10-CM

## 2022-12-16 DIAGNOSIS — F410 Panic disorder [episodic paroxysmal anxiety] without agoraphobia: Secondary | ICD-10-CM

## 2022-12-16 MED ORDER — RIZATRIPTAN BENZOATE 10 MG PO TBDP: ORAL_TABLET | ORAL | 5 refills | Status: DC

## 2022-12-16 MED ORDER — ALPRAZOLAM 0.5 MG PO TABS: 0.2500 mg | ORAL_TABLET | Freq: Every evening | ORAL | 1 refills | Status: DC | PRN

## 2022-12-16 MED ORDER — ESCITALOPRAM OXALATE 20 MG PO TABS: ORAL_TABLET | ORAL | 3 refills | Status: DC

## 2022-12-16 MED ORDER — TOPIRAMATE 50 MG PO TABS: 150.0000 mg | ORAL_TABLET | Freq: Every day | ORAL | 3 refills | Status: DC

## 2022-12-16 NOTE — Progress Notes (Signed)
Subjective: CC: Follow-up anxiety disorder PCP: Janora Norlander, DO [id number] Karen White is a 67 y.o. female presenting to clinic today for:  1.  Generalized anxiety disorder with panic attack Patient is compliant with Lexapro.  She uses half tablet of alprazolam almost nightly.  She does report chronic anxiety surrounding her daughter, Mendel Ryder.  She worries about her stress and being overloaded with work.  This often keeps her up at night and she cannot go to sleep without use of the benzodiazepine.  No reports of falls, memory changes.  She continues to struggle with food induced hives but she notes that if she really is very strict about gluten-free diet this seems to resolve the issue.  Currently being treated with a mold medication twice daily with her integrative specialist which she will be seeing in April  2.  Migraine headaches She is currently down to just 50 mg of Topamax and plans to come off of it totally.  Migraine headaches really have not recurred despite reduction.  She will need Maxalt to have on hand if she has any breakthroughs however   ROS: Per HPI  Not on File Past Medical History:  Diagnosis Date   Allergy    Anxiety    Depression    GERD (gastroesophageal reflux disease)    Migraine     Current Outpatient Medications:    Acetylcysteine (NAC PO), Take by mouth., Disp: , Rfl:    ALPRAZolam (XANAX) 0.5 MG tablet, Take 0.5-1 tablets (0.25-0.5 mg total) by mouth at bedtime as needed for anxiety., Disp: 90 tablet, Rfl: 1   b complex vitamins capsule, Take 1 capsule by mouth daily., Disp: , Rfl:    cetirizine (ZYRTEC) 10 MG chewable tablet, , Disp: , Rfl:    EPINEPHrine (EPIPEN 2-PAK) 0.3 mg/0.3 mL IJ SOAJ injection, Inject 0.3 mg into the muscle as needed for anaphylaxis., Disp: 1 each, Rfl: 0   escitalopram (LEXAPRO) 20 MG tablet, TAKE ONE (1) TABLET EACH DAY, Disp: 90 tablet, Rfl: 3   hydrOXYzine (ATARAX) 25 MG tablet, Take 0.5-1 tablets (12.5-25 mg total)  by mouth every 8 (eight) hours as needed for itching., Disp: 30 tablet, Rfl: 0   ITRACONAZOLE PO, , Disp: , Rfl:    rizatriptan (MAXALT-MLT) 10 MG disintegrating tablet, PLACE 1 TABLET ON TONGUE AND ALLOW TO DISSOLVE AS NEEDED FOR ONSET OFHEADACHE, Disp: 12 tablet, Rfl: 5   Zinc Sulfate (ZINC 15 PO), Take by mouth., Disp: , Rfl:    famotidine (PEPCID) 20 MG tablet, Take 1 tablet (20 mg total) by mouth 2 (two) times daily for 7 days., Disp: 14 tablet, Rfl: 0   topiramate (TOPAMAX) 50 MG tablet, Take 3 tablets (150 mg total) by mouth daily., Disp: 270 tablet, Rfl: 3 Social History   Socioeconomic History   Marital status: Single    Spouse name: Not on file   Number of children: Not on file   Years of education: Not on file   Highest education level: Not on file  Occupational History   Not on file  Tobacco Use   Smoking status: Former    Packs/day: 0.50    Types: Cigarettes    Quit date: 01/06/1976    Years since quitting: 46.9   Smokeless tobacco: Never  Substance and Sexual Activity   Alcohol use: Yes    Alcohol/week: 2.0 standard drinks of alcohol    Types: 2 Glasses of wine per week    Comment: One drink a week   Drug  use: No   Sexual activity: Yes    Birth control/protection: Post-menopausal  Other Topics Concern   Not on file  Social History Narrative   Not on file   Social Determinants of Health   Financial Resource Strain: Not on file  Food Insecurity: Not on file  Transportation Needs: Not on file  Physical Activity: Not on file  Stress: Not on file  Social Connections: Not on file  Intimate Partner Violence: Not on file   Family History  Problem Relation Age of Onset   Coronary artery disease Father        Age 33s   Heart disease Father    Coronary artery disease Mother        Age 79s   Cancer Mother    Cancer Maternal Grandmother        ovarian   Colon cancer Neg Hx    Colon polyps Neg Hx    Esophageal cancer Neg Hx    Rectal cancer Neg Hx    Stomach  cancer Neg Hx     Objective: Office vital signs reviewed. BP 126/77   Pulse 69   Temp 98.7 F (37.1 C)   Ht '5\' 1"'$  (1.549 m)   Wt 114 lb (51.7 kg)   SpO2 100%   BMI 21.54 kg/m   Physical Examination:  General: Awake, alert, well nourished, No acute distress HEENT: sclera white, MMM Cardio: regular rate and rhythm, S1S2 heard, no murmurs appreciated Pulm: clear to auscultation bilaterally, no wheezes, rhonchi or rales; normal work of breathing on room air Psych: Mood anxious when talking about her daughter.  She is very pleasant, interactive     12/16/2022    9:21 AM 07/24/2022   10:05 AM 05/27/2022   11:20 AM  Depression screen PHQ 2/9  Decreased Interest 0 0 0  Down, Depressed, Hopeless 0 0 0  PHQ - 2 Score 0 0 0  Altered sleeping 0    Tired, decreased energy 0    Change in appetite 0    Feeling bad or failure about yourself  0    Trouble concentrating 0    Moving slowly or fidgety/restless 0    Suicidal thoughts 0    PHQ-9 Score 0    Difficult doing work/chores Not difficult at all        12/16/2022    9:21 AM 07/24/2022   10:05 AM 12/29/2021    9:40 AM 07/28/2021    9:02 AM  GAD 7 : Generalized Anxiety Score  Nervous, Anxious, on Edge 0 0 0 0  Control/stop worrying 0 0 0 0  Worry too much - different things 0 0 0 0  Trouble relaxing 0 0 0 0  Restless 0 0 0 0  Easily annoyed or irritable 0 0 0 0  Afraid - awful might happen 0 0 0 0  Total GAD 7 Score 0 0 0 0  Anxiety Difficulty Not difficult at all Not difficult at all Not difficult at all Not difficult at all    Assessment/ Plan: 67 y.o. female   Generalized anxiety disorder with panic attacks - Plan: ToxASSURE Select 13 (MW), Urine, ALPRAZolam (XANAX) 0.5 MG tablet, escitalopram (LEXAPRO) 20 MG tablet  Chronic use of benzodiazepine for therapeutic purpose - Plan: ToxASSURE Select 13 (MW), Urine  Controlled substance agreement signed - Plan: ToxASSURE Select 13 (MW), Urine  Migraine without status  migrainosus, not intractable, unspecified migraine type - Plan: CMP14+EGFR, CBC, rizatriptan (MAXALT-MLT) 10 MG disintegrating tablet, DISCONTINUED:  topiramate (TOPAMAX) 50 MG tablet  Pure hypercholesterolemia - Plan: Lipid Panel, TSH  Xanax renewed, Lexapro renewed.  UDS and CSC were updated as per office policy today.  She is gradually titrating off of the Topamax but discontinued any refills on that.  Maxalt renewed for as needed use.  Check CMP, CBC given current but low-dose use of Topamax  Fasting labs also obtained today  Orders Placed This Encounter  Procedures   ToxASSURE Select 13 (MW), Urine    Current Outpatient Medications:    Acetylcysteine (NAC PO), Take by mouth., Disp: , Rfl:    ALPRAZolam (XANAX) 0.5 MG tablet, Take 0.5-1 tablets (0.25-0.5 mg total) by mouth at bedtime as needed for anxiety., Disp: 90 tablet, Rfl: 1   b complex vitamins capsule, Take 1 capsule by mouth daily., Disp: , Rfl:    EPINEPHrine (EPIPEN 2-PAK) 0.3 mg/0.3 mL IJ SOAJ injection, Inject 0.3 mg into the muscle as needed for anaphylaxis., Disp: 1 each, Rfl: 0   escitalopram (LEXAPRO) 20 MG tablet, TAKE ONE (1) TABLET EACH DAY, Disp: 90 tablet, Rfl: 3   famotidine (PEPCID) 20 MG tablet, Take 1 tablet (20 mg total) by mouth 2 (two) times daily for 7 days., Disp: 14 tablet, Rfl: 0   hydrOXYzine (ATARAX) 25 MG tablet, Take 0.5-1 tablets (12.5-25 mg total) by mouth every 8 (eight) hours as needed for itching., Disp: 30 tablet, Rfl: 0   Omega-3 Fatty Acids (OMEGA-3 1450 PO), Take by mouth., Disp: , Rfl:    rizatriptan (MAXALT-MLT) 10 MG disintegrating tablet, PLACE 1 TABLET ON TONGUE AND ALLOW TO DISSOLVE AS NEEDED FOR ONSET OFHEADACHE, Disp: 12 tablet, Rfl: 5   topiramate (TOPAMAX) 50 MG tablet, TAKE 3 TABLETS BY MOUTH DAILY, Disp: 270 tablet, Rfl: 0   Zinc Sulfate (ZINC 15 PO), Take by mouth., Disp: , Rfl:   CMP14+EGFR   Lipid Panel   TSH   CBC   Meds ordered this encounter  Medications    topiramate (TOPAMAX) 50 MG tablet    Sig: Take 3 tablets (150 mg total) by mouth daily.    Dispense:  270 tablet    Refill:  [place], [place] [phone]

## 2022-12-17 ENCOUNTER — Other Ambulatory Visit: Payer: Medicare PPO

## 2022-12-18 LAB — CMP14+EGFR
ALT: 24 IU/L (ref 0–32)
AST: 23 IU/L (ref 0–40)
Albumin/Globulin Ratio: 1.8 (ref 1.2–2.2)
Albumin: 4.2 g/dL (ref 3.9–4.9)
Alkaline Phosphatase: 85 IU/L (ref 44–121)
BUN/Creatinine Ratio: 17 (ref 12–28)
BUN: 17 mg/dL (ref 8–27)
Bilirubin Total: 0.4 mg/dL (ref 0.0–1.2)
CO2: 22 mmol/L (ref 20–29)
Calcium: 9.6 mg/dL (ref 8.7–10.3)
Chloride: 104 mmol/L (ref 96–106)
Creatinine, Ser: 1 mg/dL (ref 0.57–1.00)
Globulin, Total: 2.4 g/dL (ref 1.5–4.5)
Glucose: 85 mg/dL (ref 70–99)
Potassium: 5 mmol/L (ref 3.5–5.2)
Sodium: 141 mmol/L (ref 134–144)
Total Protein: 6.6 g/dL (ref 6.0–8.5)
eGFR: 63 mL/min/{1.73_m2} (ref >59)

## 2022-12-18 LAB — LIPID PANEL
Chol/HDL Ratio: 2.3 ratio (ref 0.0–4.4)
Cholesterol, Total: 179 mg/dL (ref 100–199)
HDL: 79 mg/dL (ref >39)
LDL Chol Calc (NIH): 90 mg/dL (ref 0–99)
Triglycerides: 48 mg/dL (ref 0–149)
VLDL Cholesterol Cal: 10 mg/dL (ref 5–40)

## 2022-12-18 LAB — CBC
Hematocrit: 44.5 % (ref 34.0–46.6)
Hemoglobin: 14.5 g/dL (ref 11.1–15.9)
MCH: 29.4 pg (ref 26.6–33.0)
MCHC: 32.6 g/dL (ref 31.5–35.7)
MCV: 90 fL (ref 79–97)
Platelets: 200 10*3/uL (ref 150–450)
RBC: 4.94 x10E6/uL (ref 3.77–5.28)
RDW: 13.2 % (ref 11.7–15.4)
WBC: 4.2 10*3/uL (ref 3.4–10.8)

## 2022-12-18 LAB — TSH: TSH: 3.39 u[IU]/mL (ref 0.450–4.500)

## 2022-12-19 LAB — TOXASSURE SELECT 13 (MW), URINE

## 2023-02-03 DIAGNOSIS — M9905 Segmental and somatic dysfunction of pelvic region: Secondary | ICD-10-CM | POA: Diagnosis not present

## 2023-02-03 DIAGNOSIS — S233XXA Sprain of ligaments of thoracic spine, initial encounter: Secondary | ICD-10-CM | POA: Diagnosis not present

## 2023-02-03 DIAGNOSIS — M9902 Segmental and somatic dysfunction of thoracic region: Secondary | ICD-10-CM | POA: Diagnosis not present

## 2023-02-03 DIAGNOSIS — M9901 Segmental and somatic dysfunction of cervical region: Secondary | ICD-10-CM | POA: Diagnosis not present

## 2023-02-03 DIAGNOSIS — M9903 Segmental and somatic dysfunction of lumbar region: Secondary | ICD-10-CM | POA: Diagnosis not present

## 2023-02-03 DIAGNOSIS — S335XXA Sprain of ligaments of lumbar spine, initial encounter: Secondary | ICD-10-CM | POA: Diagnosis not present

## 2023-02-03 DIAGNOSIS — M9904 Segmental and somatic dysfunction of sacral region: Secondary | ICD-10-CM | POA: Diagnosis not present

## 2023-02-03 DIAGNOSIS — M5440 Lumbago with sciatica, unspecified side: Secondary | ICD-10-CM | POA: Diagnosis not present

## 2023-02-03 DIAGNOSIS — S134XXA Sprain of ligaments of cervical spine, initial encounter: Secondary | ICD-10-CM | POA: Diagnosis not present

## 2023-02-11 DIAGNOSIS — S134XXA Sprain of ligaments of cervical spine, initial encounter: Secondary | ICD-10-CM | POA: Diagnosis not present

## 2023-02-11 DIAGNOSIS — S233XXA Sprain of ligaments of thoracic spine, initial encounter: Secondary | ICD-10-CM | POA: Diagnosis not present

## 2023-02-11 DIAGNOSIS — M9905 Segmental and somatic dysfunction of pelvic region: Secondary | ICD-10-CM | POA: Diagnosis not present

## 2023-02-11 DIAGNOSIS — M5440 Lumbago with sciatica, unspecified side: Secondary | ICD-10-CM | POA: Diagnosis not present

## 2023-02-11 DIAGNOSIS — M9904 Segmental and somatic dysfunction of sacral region: Secondary | ICD-10-CM | POA: Diagnosis not present

## 2023-02-11 DIAGNOSIS — M9901 Segmental and somatic dysfunction of cervical region: Secondary | ICD-10-CM | POA: Diagnosis not present

## 2023-02-11 DIAGNOSIS — M9902 Segmental and somatic dysfunction of thoracic region: Secondary | ICD-10-CM | POA: Diagnosis not present

## 2023-02-11 DIAGNOSIS — S335XXA Sprain of ligaments of lumbar spine, initial encounter: Secondary | ICD-10-CM | POA: Diagnosis not present

## 2023-02-11 DIAGNOSIS — M9903 Segmental and somatic dysfunction of lumbar region: Secondary | ICD-10-CM | POA: Diagnosis not present

## 2023-02-23 ENCOUNTER — Telehealth (INDEPENDENT_AMBULATORY_CARE_PROVIDER_SITE_OTHER): Payer: Medicare PPO | Admitting: Family Medicine

## 2023-02-23 ENCOUNTER — Telehealth: Payer: Self-pay | Admitting: Family Medicine

## 2023-02-23 ENCOUNTER — Encounter: Payer: Self-pay | Admitting: Family Medicine

## 2023-02-23 DIAGNOSIS — L509 Urticaria, unspecified: Secondary | ICD-10-CM

## 2023-02-23 DIAGNOSIS — T7840XA Allergy, unspecified, initial encounter: Secondary | ICD-10-CM | POA: Diagnosis not present

## 2023-02-23 MED ORDER — FAMOTIDINE 20 MG PO TABS: 20.0000 mg | ORAL_TABLET | Freq: Two times a day (BID) | ORAL | 0 refills | Status: DC

## 2023-02-23 MED ORDER — PREDNISONE 50 MG PO TABS
50.0000 mg | ORAL_TABLET | Freq: Every day | ORAL | 0 refills | Status: DC
Start: 2023-02-23 — End: 2023-04-23

## 2023-02-23 MED ORDER — HYDROXYZINE HCL 25 MG PO TABS: 12.5000 mg | ORAL_TABLET | Freq: Three times a day (TID) | ORAL | 0 refills | Status: DC | PRN

## 2023-02-23 NOTE — Telephone Encounter (Signed)
done

## 2023-02-23 NOTE — Telephone Encounter (Signed)
Pt called to let Dr Nadine Counts know that she does want Dr Nadine Counts to go ahead and refer her to see a true allergy specialist.

## 2023-02-23 NOTE — Progress Notes (Signed)
MyChart Video visit  Subjective: CC: hives PCP: Raliegh Ip, DO [id number] K Conwill is a 67 y.o. female. Patient provides verbal consent for consult held via video.  Due to COVID-19 pandemic this visit was conducted virtually. This visit type was conducted due to national recommendations for restrictions regarding the COVID-19 Pandemic (e.g. social distancing, sheltering in place) in an effort to limit this patient's exposure and mitigate transmission in our community. All issues noted in this document were discussed and addressed.  A physical exam was not performed with this format.   Location of patient: home Location of provider: [facility] Others present for call: none  1. Hives She reports Saturday night she ate chicken tenders and now she is covered in hives.  She has taken benadryl/ zyrtec/ hydroxyzine/ famotidine with no improvement.  She notes it has not been this bad in quite some time.  She does not feel like it severe enough to use her EpiPen but is quite uncomfortable with these hives.   ROS: Per HPI  Not on File Past Medical History:  Diagnosis Date   Allergy    Anxiety    Depression    GERD (gastroesophageal reflux disease)    Migraine     Current Outpatient Medications:    Acetylcysteine (NAC PO), Take by mouth., Disp: , Rfl:    ALPRAZolam (XANAX) 0.5 MG tablet, Take 0.5-1 tablets (0.25-0.5 mg total) by mouth at bedtime as needed for anxiety., Disp: 90 tablet, Rfl: 1   b complex vitamins capsule, Take 1 capsule by mouth daily., Disp: , Rfl:    cetirizine (ZYRTEC) 10 MG chewable tablet, , Disp: , Rfl:    EPINEPHrine (EPIPEN 2-PAK) 0.3 mg/0.3 mL IJ SOAJ injection, Inject 0.3 mg into the muscle as needed for anaphylaxis., Disp: 1 each, Rfl: 0   escitalopram (LEXAPRO) 20 MG tablet, TAKE ONE (1) TABLET EACH DAY, Disp: 90 tablet, Rfl: 3   famotidine (PEPCID) 20 MG tablet, Take 1 tablet (20 mg total) by mouth 2 (two) times daily for 7 days., Disp: 14 tablet, Rfl: 0    hydrOXYzine (ATARAX) 25 MG tablet, Take 0.5-1 tablets (12.5-25 mg total) by mouth every 8 (eight) hours as needed for itching., Disp: 30 tablet, Rfl: 0   ITRACONAZOLE PO, , Disp: , Rfl:    rizatriptan (MAXALT-MLT) 10 MG disintegrating tablet, PLACE 1 TABLET ON TONGUE AND ALLOW TO DISSOLVE AS NEEDED FOR ONSET OFHEADACHE, Disp: 12 tablet, Rfl: 5   Zinc Sulfate (ZINC 15 PO), Take by mouth., Disp: , Rfl:  HEENT: No facial swelling Pulm: normal WOB on room air. Skin: Hives appreciated on bilateral thighs Assessment/ Plan: 67 y.o. female   Allergic reaction, initial encounter - Plan: famotidine (PEPCID) 20 MG tablet, hydrOXYzine (ATARAX) 25 MG tablet, predniSONE (DELTASONE) 50 MG tablet  Hives - Plan: famotidine (PEPCID) 20 MG tablet, hydrOXYzine (ATARAX) 25 MG tablet, predniSONE (DELTASONE) 50 MG tablet  Likely secondary to consumption of chicken tenders recently.  Ideally I would have like to give her a corticosteroid injection but given that she has a virtual visit I am not sure that her insurance would pay for it.  She is going to try and use the oral high-dose prednisone over the next couple of days but if she does not see any significant improvement or if she has abrupt worsening she will contact me and is willing just to go through with a corticosteroid injection even if it is not covered by insurance.  Resume use of Atarax, Pepcid.  Start  time: 1:11pm End time: 1:21pm  Total time spent on patient care (including video visit/ documentation): 11 minutes  Roslin Norwood Hulen Skains, DO [facility] [phone]

## 2023-02-24 DIAGNOSIS — S134XXA Sprain of ligaments of cervical spine, initial encounter: Secondary | ICD-10-CM | POA: Diagnosis not present

## 2023-02-24 DIAGNOSIS — M9902 Segmental and somatic dysfunction of thoracic region: Secondary | ICD-10-CM | POA: Diagnosis not present

## 2023-02-24 DIAGNOSIS — M5440 Lumbago with sciatica, unspecified side: Secondary | ICD-10-CM | POA: Diagnosis not present

## 2023-02-24 DIAGNOSIS — M9905 Segmental and somatic dysfunction of pelvic region: Secondary | ICD-10-CM | POA: Diagnosis not present

## 2023-02-24 DIAGNOSIS — S233XXA Sprain of ligaments of thoracic spine, initial encounter: Secondary | ICD-10-CM | POA: Diagnosis not present

## 2023-02-24 DIAGNOSIS — M9903 Segmental and somatic dysfunction of lumbar region: Secondary | ICD-10-CM | POA: Diagnosis not present

## 2023-02-24 DIAGNOSIS — M9904 Segmental and somatic dysfunction of sacral region: Secondary | ICD-10-CM | POA: Diagnosis not present

## 2023-02-24 DIAGNOSIS — S335XXA Sprain of ligaments of lumbar spine, initial encounter: Secondary | ICD-10-CM | POA: Diagnosis not present

## 2023-02-24 DIAGNOSIS — M9901 Segmental and somatic dysfunction of cervical region: Secondary | ICD-10-CM | POA: Diagnosis not present

## 2023-02-24 NOTE — Telephone Encounter (Signed)
Pt says she already scheduled appt for this Friday.

## 2023-02-26 ENCOUNTER — Encounter: Payer: Self-pay | Admitting: Allergy & Immunology

## 2023-02-26 ENCOUNTER — Other Ambulatory Visit: Payer: Self-pay

## 2023-02-26 ENCOUNTER — Ambulatory Visit: Payer: Medicare PPO | Admitting: Allergy & Immunology

## 2023-02-26 VITALS — BP 168/88 | HR 93 | Temp 98.7°F | Resp 20 | Ht 61.5 in | Wt 119.2 lb

## 2023-02-26 DIAGNOSIS — R634 Abnormal weight loss: Secondary | ICD-10-CM

## 2023-02-26 DIAGNOSIS — L508 Other urticaria: Secondary | ICD-10-CM | POA: Diagnosis not present

## 2023-02-26 NOTE — Patient Instructions (Addendum)
1. Chronic urticaria - We are getting an extensive lab work up to look for weird causes of hives.  - ANA, IFA (with reflex) - Chronic Urticaria - C-reactive protein - CMP14+EGFR - Sedimentation rate - Tryptase - Thyroid antibodies - CBC With Differential - We are going to get the most common food allergens (rules out > 95% of all food allergies) - F245-IgE Egg, Whole - Milk IgE - Allergy Panel 18, Nut Mix Group - Allergy Panel 19, Seafood Group - Allergen Sesame f10 - Soybean IgE - Wheat IgE - I would let's do cetirizine 10mg  twice daily + Pepcid 20mg  twice daily. - We are going to work on getting Xolair approved.  - Information on Xolair provided today. - Tammy will get in touch with you to discuss this.   2. Return in about 8 weeks (around 04/23/2023). You can have the follow up appointment with Dr. Dellis Anes only.    Please inform us of any Emergency Department visits, hospitalizations, or changes in symptoms. Call us before going to the ED for breathing or allergy symptoms since we might be able to fit you in for a sick visit. Feel free to contact us anytime with any questions, problems, or concerns.  It was a pleasure to meet you today!  Websites that have reliable patient information: 1. American Academy of Asthma, Allergy, and Immunology: www.aaaai.org 2. Food Allergy Research and Education (FARE): foodallergy.org 3. Mothers of Asthmatics: http://www.asthmacommunitynetwork.org 4. American College of Allergy, Asthma, and Immunology: www.acaai.org   COVID-19 Vaccine Information can be found at: [email or web address] For questions related to vaccine distribution or appointments, please email vaccine@ [email or web address] or call [phone].   We realize that you might be concerned about having an allergic reaction to the COVID19 vaccines. To help with that concern, WE ARE OFFERING THE COVID19 VACCINES IN OUR OFFICE! Ask the  front desk for dates!     "Like" [place] on Facebook and Instagram for our latest updates!      A healthy democracy works best when [employer] participate! Make sure you are registered to vote! If you have moved or changed any of your contact information, you will need to get this updated before voting!  In some cases, you MAY be able to register to vote online: [email or web address]

## 2023-02-26 NOTE — Progress Notes (Signed)
NEW PATIENT  Date of Service/Encounter:  02/26/23  Consult requested by: Raliegh Ip, DO   Assessment:   No diagnosis found.  Plan/Recommendations:    There are no Patient Instructions on file for this visit.   {Blank single:19197::"This note in its entirety was forwarded to the Provider who requested this consultation."}  Subjective:   Karen White is a 67 y.o. female presenting today for evaluation of  Chief Complaint  Patient presents with   Allergic Reaction   Urticaria    Karen White has a history of the following: Patient Active Problem List   Diagnosis Date Noted   Pure hypercholesterolemia 12/31/2020   Generalized anxiety disorder with panic attacks 02/21/2020   Migraine 09/11/2018   Acute bronchitis 09/21/2016   Menopause 09/21/2016   Lipoma of back 01/06/2012   Precordial pain 12/14/2011   Elevated blood pressure 12/14/2011   Cardiac murmur 12/14/2011   Cervicalgia 08/21/2011   ESOPHAGEAL STRICTURE 08/12/2010   ESOPHAGEAL REFLUX 07/28/2010   DYSPHAGIA UNSPECIFIED 07/28/2010   DIARRHEA 07/28/2010    History obtained from: chart review and {Persons; PED relatives w/patient:19415::"patient"}.  Karen White was referred by Romeo Apple is a 67 y.o. female presenting for {Blank single:19197::"a food challenge","a drug challenge","skin testing","a sick visit","an evaluation of ***","a follow up visit"}.  She has traced this back to 67 years old. She had some food allergies such as food allergies and milk (hives, lip swelling, and throat swelling). 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   She was at [employer] and graduated in 1981. She was in her senior year and saw someone in [place]. She had skin testing and she did shots for everything that she ate. She reacted with baked potato and an umber of other items. This was in 1981 and she took food allery shots. She did this through 1984.   All of her  allergies go away after she gives birthto her first child in 48. She did well through a second birth at 41. Then in 1992, she had re-emergence of her foods allergies. All of her symptoms flares around that time. They were redoing hre home and within 15 minuts of putting the carpet down, she had renewed reactions.   She called this physician in [facility]. She went back through all of the testing and "oral challenge test". She did blood work and started shots again for what she ate. She kept getting worse and worse. It was suggsted that she go to the [facility] in [place], [place]. She stayed in a "safe house" around that time for two months.   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   When she was in [place] and she did extensive testing with allergy shots and running and chelation therapy. 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 She had these placed back throughout her entire lif.e She had all of her amalgams removed including crowns. She had to crowns pulled.   She went to [facility] in [place] who replaced all of these rowns. It took her around 19 months to get this all removed. She was replaced with pure porcelain fillings.   :In the menatime, she went to the [facility]  (Dr. Andrey Campanile) in [place] to get chelation therapy and MPS therpay. She was a 9/10 in mercruity posiing. This was 49.   THen she took the chelation therapy for one  year or so. She got infusions weekly to get rid of the heavy metal from her body. She was not taking shots for food alelrgies at that point.   Then in 2015, she started having hives related to foods again. When she is at her worst in the 1990s, she would touch a newspaper and breakout. She had to put her newspapers in the oven. She went back to Dr. Andrey Campanile. 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 She was restartd and continued for a few months.  2015 through 2023, she was fine.  Summer 2023, she developed some more hives. She had stayed sensitive to oinions from 2015 (in any form). She thinks that she reacts to preservatives. She did take the COVID vaccines (she got the Pfizer vaccine every time).   She did to [facility]. She was diagnosed with lead and mold poisoning.   She has not been to a true allergist since the 1980s.    {Blank single:19197::"Asthma/Respiratory Symptom History: ***"," "}  {Blank single:19197::"Allergic Rhinitis Symptom History: ***"," "}  {Blank single:19197::"Food Allergy Symptom History: ***"," "}  {Blank single:19197::"Skin Symptom History: ***"," "}  {Blank single:19197::"GERD Symptom History: ***"," "}  ***Otherwise, there is no history of other atopic diseases, including {Blank multiple:19196:o:"asthma","food allergies","drug allergies","environmental allergies","stinging insect allergies","eczema","urticaria","contact dermatitis"}. There is no significant infectious history. ***Vaccinations are up to date.    Past Medical History: Patient Active Problem List   Diagnosis Date Noted   Pure hypercholesterolemia 12/31/2020   Generalized anxiety disorder with panic attacks 02/21/2020   Migraine 09/11/2018   Acute bronchitis 09/21/2016   Menopause 09/21/2016   Lipoma of back 01/06/2012   Precordial pain 12/14/2011   Elevated blood pressure 12/14/2011   Cardiac murmur 12/14/2011   Cervicalgia 08/21/2011   ESOPHAGEAL STRICTURE 08/12/2010   ESOPHAGEAL REFLUX 07/28/2010   DYSPHAGIA UNSPECIFIED 07/28/2010   DIARRHEA 07/28/2010    Medication List:  Allergies as of 02/26/2023   Not on File      Medication List        Accurate as of Feb 26, 2023  1:37 PM. If you have any questions, ask your nurse or doctor.          ALPRAZolam 0.5 MG tablet Commonly known as: XANAX Take 0.5-1 tablets (0.25-0.5 mg total) by mouth at bedtime as needed for anxiety.   b complex vitamins capsule Take 1 capsule by mouth daily.    EPINEPHrine 0.3 mg/0.3 mL Soaj injection Commonly known as: EpiPen 2-Pak Inject 0.3 mg into the muscle as needed for anaphylaxis.   escitalopram 20 MG tablet Commonly known as: LEXAPRO TAKE ONE (1) TABLET EACH DAY   famotidine 20 MG tablet Commonly known as: Pepcid Take 1 tablet (20 mg total) by mouth 2 (two) times daily for 7 days.   hydrOXYzine 25 MG tablet Commonly known as: ATARAX Take 0.5-1 tablets (12.5-25 mg total) by mouth every 8 (eight) hours as needed for itching.   ITRACONAZOLE PO   predniSONE 50 MG tablet Commonly known as: DELTASONE Take 1 tablet (50 mg total) by mouth daily with breakfast.   rizatriptan 10 MG disintegrating tablet Commonly known as: MAXALT-MLT PLACE 1 TABLET ON TONGUE AND ALLOW TO DISSOLVE AS NEEDED FOR ONSET OFHEADACHE   ZINC 15 PO Take by mouth.   ZyrTEC 10 MG chewable tablet Generic drug: cetirizine        Birth History: {Blank single:19197::"non-contributory","born premature and spent time in the NICU","born at term without complications"}  Developmental History: Marcela has met all milestones on  time. She has required no {Blank multiple:19196:a:"speech therapy","occupational therapy","physical therapy"}. ***non-contributory  Past Surgical History: Past Surgical History:  Procedure Laterality Date   mercury posion from fillings     Schwanoma tumor resection     TONGUE BIOPSY       Family History: Family History  Problem Relation Age of Onset   Coronary artery disease Father        Age 31s   Heart disease Father    Coronary artery disease Mother        Age 67s   Cancer Mother    Cancer Maternal Grandmother        ovarian   Colon cancer Neg Hx    Colon polyps Neg Hx    Esophageal cancer Neg Hx    Rectal cancer Neg Hx    Stomach cancer Neg Hx      Social History: Skarlette lives at home with family in a house that is 66 years old.  There is carpeting in the living areas and hardwoods in the bedroom.  They have the above.   Cooling.  There are no animals inside or outside of the home.  There are dust mite covers on the bed, but not the pillows.  There is no tobacco exposure.  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  There is a HEPA filter in the home.  There is no fume, chemical, or dust exposure.   ROS     Objective:   Pulse 93, temperature 98.7 F (37.1 C), height 5' 1.5" (1.562 m), weight 119 lb 4 oz (54.1 kg), SpO2 97 %. Body mass index is 22.17 kg/m.     Physical Exam   Diagnostic studies: {Blank single:19197::"none","deferred due to recent antihistamine use","deferred due to insurance stipulations that refuse to pay for testing at initial visits, making it more difficult for patients to get the care they need","labs sent instead"," "}  Spirometry: {Blank single:19197::"results normal (FEV1: ***%, FVC: ***%, FEV1/FVC: ***%)","results abnormal (FEV1: ***%, FVC: ***%, FEV1/FVC: ***%)"}.    {Blank single:19197::"Spirometry consistent with mild obstructive disease","Spirometry consistent with moderate obstructive disease","Spirometry consistent with severe obstructive disease","Spirometry consistent with possible restrictive disease","Spirometry consistent with mixed obstructive and restrictive disease","Spirometry uninterpretable due to technique","Spirometry consistent with normal pattern"}. {Blank single:19197::"Albuterol/Atrovent nebulizer","Xopenex/Atrovent nebulizer","Albuterol nebulizer","Albuterol four puffs via MDI","Xopenex four puffs via MDI"} treatment given in clinic with {Blank single:19197::"significant improvement in FEV1 per ATS criteria","significant improvement in FVC per ATS criteria","significant improvement in FEV1 and FVC per ATS criteria","improvement in FEV1, but not significant per ATS criteria","improvement in FVC, but not significant per ATS criteria","improvement in FEV1 and FVC, but not significant per ATS criteria","no improvement"}.  Allergy Studies:  {Blank single:19197::"none","labs sent instead"," "}    {Blank single:19197::"Allergy testing results were read and interpreted by myself, documented by clinical staff."," "}         Malachi Bonds, MD [facility]

## 2023-02-27 LAB — ALLERGY PANEL 18, NUT MIX GROUP

## 2023-02-27 LAB — CMP14+EGFR
AST: 33 IU/L (ref 0–40)
Albumin/Globulin Ratio: 1.7 (ref 1.2–2.2)
BUN/Creatinine Ratio: 25 (ref 12–28)
BUN: 24 mg/dL (ref 8–27)

## 2023-02-27 LAB — CBC WITH DIFFERENTIAL
Basophils Absolute: 0 10*3/uL (ref 0.0–0.2)
Basos: 0 %
EOS (ABSOLUTE): 0 10*3/uL (ref 0.0–0.4)
Eos: 0 %
MCV: 86 fL (ref 79–97)
Monocytes: 1 %
Neutrophils Absolute: 7.3 10*3/uL — ABNORMAL HIGH (ref 1.4–7.0)
RBC: 5.07 x10E6/uL (ref 3.77–5.28)

## 2023-02-27 LAB — ANTINUCLEAR ANTIBODIES, IFA

## 2023-02-27 LAB — ALLERGY PANEL 19, SEAFOOD GROUP

## 2023-03-01 LAB — TRYPTASE: Tryptase: 8.1 ug/L (ref 2.2–13.2)

## 2023-03-01 LAB — CMP14+EGFR
Albumin: 4.8 g/dL (ref 3.9–4.9)
Bilirubin Total: 0.4 mg/dL (ref 0.0–1.2)
CO2: 23 mmol/L (ref 20–29)
Creatinine, Ser: 0.95 mg/dL (ref 0.57–1.00)

## 2023-03-01 LAB — SEDIMENTATION RATE: Sed Rate: 6 mm/hr (ref 0–40)

## 2023-03-01 LAB — ALLERGY PANEL 19, SEAFOOD GROUP

## 2023-03-01 LAB — CBC WITH DIFFERENTIAL
Hematocrit: 43.7 % (ref 34.0–46.6)
MCH: 28.4 pg (ref 26.6–33.0)
WBC: 8.5 10*3/uL (ref 3.4–10.8)

## 2023-03-01 LAB — CHRONIC URTICARIA

## 2023-03-01 LAB — F245-IGE EGG, WHOLE: Egg, Whole IgE: <0.1 kU/L

## 2023-03-01 LAB — ALLERGEN, WHEAT, F4: Wheat IgE: <0.1 kU/L

## 2023-03-02 ENCOUNTER — Encounter: Payer: Self-pay | Admitting: Allergy & Immunology

## 2023-03-02 DIAGNOSIS — R634 Abnormal weight loss: Secondary | ICD-10-CM | POA: Insufficient documentation

## 2023-03-02 DIAGNOSIS — L508 Other urticaria: Secondary | ICD-10-CM | POA: Insufficient documentation

## 2023-03-03 LAB — ALLERGY PANEL 18, NUT MIX GROUP: F202-IgE Cashew Nut: <0.1 kU/L

## 2023-03-03 LAB — ALLERGEN MILK: Milk IgE: <0.1 kU/L

## 2023-03-03 LAB — THYROID ANTIBODIES: Thyroglobulin Antibody: <1 IU/mL (ref 0.0–0.9)

## 2023-03-03 LAB — ALLERGY PANEL 19, SEAFOOD GROUP: Allergen Salmon IgE: <0.1 kU/L

## 2023-03-03 LAB — CMP14+EGFR: Sodium: 139 mmol/L (ref 134–144)

## 2023-03-04 LAB — THYROID ANTIBODIES: Thyroperoxidase Ab SerPl-aCnc: 12 IU/mL (ref 0–34)

## 2023-03-04 LAB — ALLERGY PANEL 18, NUT MIX GROUP: Pecan Nut IgE: <0.1 kU/L

## 2023-03-04 LAB — ALLERGY PANEL 19, SEAFOOD GROUP: Codfish IgE: <0.1 kU/L

## 2023-03-04 LAB — CBC WITH DIFFERENTIAL
Lymphs: 11 %
Monocytes Absolute: 0.1 10*3/uL (ref 0.1–0.9)
RDW: 12.8 % (ref 11.7–15.4)

## 2023-03-04 LAB — CMP14+EGFR
ALT: 30 IU/L (ref 0–32)
Globulin, Total: 2.8 g/dL (ref 1.5–4.5)

## 2023-03-08 ENCOUNTER — Telehealth: Payer: Self-pay | Admitting: *Deleted

## 2023-03-08 NOTE — Telephone Encounter (Signed)
-----   Message from Alfonse Spruce, MD sent at 03/02/2023  7:55 AM EDT ----- Xolair for CIU. Consent signed. No sample given.

## 2023-03-08 NOTE — Telephone Encounter (Signed)
Spoke to patient and discussed PAP for Xolair due to her MCR ins. Will mail app and she will fax back to me to submit. I will be in touch regarding the approval process

## 2023-03-09 LAB — CMP14+EGFR
Alkaline Phosphatase: 93 IU/L (ref 44–121)
Calcium: 9.6 mg/dL (ref 8.7–10.3)
Chloride: 99 mmol/L (ref 96–106)
Glucose: 116 mg/dL — ABNORMAL HIGH (ref 70–99)
Potassium: 4 mmol/L (ref 3.5–5.2)
Total Protein: 7.6 g/dL (ref 6.0–8.5)
eGFR: 66 mL/min/{1.73_m2} (ref >59)

## 2023-03-09 LAB — ALLERGY PANEL 19, SEAFOOD GROUP
F080-IgE Lobster: <0.1 kU/L
Tuna: <0.1 kU/L

## 2023-03-09 LAB — ALLERGY PANEL 18, NUT MIX GROUP
F020-IgE Almond: <0.1 kU/L
Sesame Seed IgE: <0.1 kU/L

## 2023-03-09 LAB — CBC WITH DIFFERENTIAL
Hemoglobin: 14.4 g/dL (ref 11.1–15.9)
Immature Grans (Abs): 0.1 10*3/uL (ref 0.0–0.1)
Immature Granulocytes: 1 %
Lymphocytes Absolute: 1 10*3/uL (ref 0.7–3.1)
MCHC: 33 g/dL (ref 31.5–35.7)
Neutrophils: 87 %

## 2023-03-09 LAB — C-REACTIVE PROTEIN: CRP: 1 mg/L (ref 0–10)

## 2023-03-09 LAB — ALLERGEN SOYBEAN: Soybean IgE: <0.1 kU/L

## 2023-03-09 NOTE — Telephone Encounter (Signed)
Awesome - thank you, Tammy!

## 2023-03-18 DIAGNOSIS — M9903 Segmental and somatic dysfunction of lumbar region: Secondary | ICD-10-CM | POA: Diagnosis not present

## 2023-03-18 DIAGNOSIS — M9901 Segmental and somatic dysfunction of cervical region: Secondary | ICD-10-CM | POA: Diagnosis not present

## 2023-03-18 DIAGNOSIS — M5440 Lumbago with sciatica, unspecified side: Secondary | ICD-10-CM | POA: Diagnosis not present

## 2023-03-18 DIAGNOSIS — S335XXA Sprain of ligaments of lumbar spine, initial encounter: Secondary | ICD-10-CM | POA: Diagnosis not present

## 2023-03-18 DIAGNOSIS — S134XXA Sprain of ligaments of cervical spine, initial encounter: Secondary | ICD-10-CM | POA: Diagnosis not present

## 2023-03-18 DIAGNOSIS — M9902 Segmental and somatic dysfunction of thoracic region: Secondary | ICD-10-CM | POA: Diagnosis not present

## 2023-03-18 DIAGNOSIS — S233XXA Sprain of ligaments of thoracic spine, initial encounter: Secondary | ICD-10-CM | POA: Diagnosis not present

## 2023-03-18 DIAGNOSIS — M9905 Segmental and somatic dysfunction of pelvic region: Secondary | ICD-10-CM | POA: Diagnosis not present

## 2023-03-18 DIAGNOSIS — M9904 Segmental and somatic dysfunction of sacral region: Secondary | ICD-10-CM | POA: Diagnosis not present

## 2023-03-29 ENCOUNTER — Ambulatory Visit (INDEPENDENT_AMBULATORY_CARE_PROVIDER_SITE_OTHER): Payer: Medicare PPO

## 2023-03-29 DIAGNOSIS — L501 Idiopathic urticaria: Secondary | ICD-10-CM

## 2023-03-29 MED ORDER — OMALIZUMAB 300 MG/2  ML ~~LOC~~ SOSY
300.0000 mg | PREFILLED_SYRINGE | SUBCUTANEOUS | Status: AC
  Administered 2023-03-29 – 2023-05-26 (×3): 300 mg via SUBCUTANEOUS

## 2023-03-29 NOTE — Progress Notes (Signed)
Immunotherapy   Patient Details  Name: Karen White MRN: [id number] Date of Birth: 1956-04-16  03/29/2023  Karen White started injections for hives. Patient received 300 mg dose of Xolair. Patient waited 30 minutes with no problems.   Frequency:every 28 days Epi-Pen:Epi-Pen Available  Consent signed and patient instructions given.   Dub Mikes 03/29/2023, 2:53 PM

## 2023-03-30 DIAGNOSIS — S134XXA Sprain of ligaments of cervical spine, initial encounter: Secondary | ICD-10-CM | POA: Diagnosis not present

## 2023-03-30 DIAGNOSIS — M9903 Segmental and somatic dysfunction of lumbar region: Secondary | ICD-10-CM | POA: Diagnosis not present

## 2023-03-30 DIAGNOSIS — M5440 Lumbago with sciatica, unspecified side: Secondary | ICD-10-CM | POA: Diagnosis not present

## 2023-03-30 DIAGNOSIS — S335XXA Sprain of ligaments of lumbar spine, initial encounter: Secondary | ICD-10-CM | POA: Diagnosis not present

## 2023-03-30 DIAGNOSIS — M9904 Segmental and somatic dysfunction of sacral region: Secondary | ICD-10-CM | POA: Diagnosis not present

## 2023-03-30 DIAGNOSIS — M9902 Segmental and somatic dysfunction of thoracic region: Secondary | ICD-10-CM | POA: Diagnosis not present

## 2023-03-30 DIAGNOSIS — M9901 Segmental and somatic dysfunction of cervical region: Secondary | ICD-10-CM | POA: Diagnosis not present

## 2023-03-30 DIAGNOSIS — M9905 Segmental and somatic dysfunction of pelvic region: Secondary | ICD-10-CM | POA: Diagnosis not present

## 2023-03-30 DIAGNOSIS — S233XXA Sprain of ligaments of thoracic spine, initial encounter: Secondary | ICD-10-CM | POA: Diagnosis not present

## 2023-04-20 DIAGNOSIS — M9905 Segmental and somatic dysfunction of pelvic region: Secondary | ICD-10-CM | POA: Diagnosis not present

## 2023-04-20 DIAGNOSIS — M9901 Segmental and somatic dysfunction of cervical region: Secondary | ICD-10-CM | POA: Diagnosis not present

## 2023-04-20 DIAGNOSIS — M9903 Segmental and somatic dysfunction of lumbar region: Secondary | ICD-10-CM | POA: Diagnosis not present

## 2023-04-20 DIAGNOSIS — M9904 Segmental and somatic dysfunction of sacral region: Secondary | ICD-10-CM | POA: Diagnosis not present

## 2023-04-20 DIAGNOSIS — S335XXA Sprain of ligaments of lumbar spine, initial encounter: Secondary | ICD-10-CM | POA: Diagnosis not present

## 2023-04-20 DIAGNOSIS — M5440 Lumbago with sciatica, unspecified side: Secondary | ICD-10-CM | POA: Diagnosis not present

## 2023-04-20 DIAGNOSIS — S233XXA Sprain of ligaments of thoracic spine, initial encounter: Secondary | ICD-10-CM | POA: Diagnosis not present

## 2023-04-20 DIAGNOSIS — M9902 Segmental and somatic dysfunction of thoracic region: Secondary | ICD-10-CM | POA: Diagnosis not present

## 2023-04-20 DIAGNOSIS — S134XXA Sprain of ligaments of cervical spine, initial encounter: Secondary | ICD-10-CM | POA: Diagnosis not present

## 2023-04-23 ENCOUNTER — Encounter: Payer: Self-pay | Admitting: Allergy & Immunology

## 2023-04-23 ENCOUNTER — Ambulatory Visit: Payer: Medicare PPO | Admitting: Allergy & Immunology

## 2023-04-23 VITALS — BP 122/70 | HR 80 | Temp 98.0°F | Resp 16 | Ht 59.84 in | Wt 135.8 lb

## 2023-04-23 DIAGNOSIS — R634 Abnormal weight loss: Secondary | ICD-10-CM | POA: Diagnosis not present

## 2023-04-23 DIAGNOSIS — L501 Idiopathic urticaria: Secondary | ICD-10-CM | POA: Diagnosis not present

## 2023-04-23 MED ORDER — CETIRIZINE HCL 10 MG PO TABS: 10.0000 mg | ORAL_TABLET | Freq: Two times a day (BID) | ORAL | 1 refills | Status: DC

## 2023-04-23 NOTE — Progress Notes (Signed)
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  - Stop the Pepcid and just continue with Zyrtec twice daily. - We will discuss stopping the Zyrtec at the next visit. - Make sure you take your EpiPen with you all of the time. - Call with any problems.   2. Return in about 6 months (around 10/24/2023).  Subjective:   Karen White is a 67 y.o. female presenting today for follow up of  Chief Complaint  Patient presents with   Urticaria    Says they are gone with the treatment plan.    Karen White has a history of the following: Patient Active Problem List   Diagnosis Date Noted   Chronic urticaria 03/02/2023   Weight loss 03/02/2023   Pure hypercholesterolemia 12/31/2020   Generalized anxiety disorder with panic attacks 02/21/2020   Migraine 09/11/2018   Acute bronchitis 09/21/2016   Menopause 09/21/2016   Lipoma of back 01/06/2012   Precordial pain 12/14/2011   Elevated blood pressure 12/14/2011   Cardiac murmur 12/14/2011   Cervicalgia 08/21/2011   ESOPHAGEAL STRICTURE 08/12/2010   ESOPHAGEAL REFLUX 07/28/2010   DYSPHAGIA UNSPECIFIED 07/28/2010   DIARRHEA 07/28/2010    History obtained from: chart review and patient.  Karen White is a 67 y.o. female presenting for a follow up visit. She was last seen in May 2024 as a New Patient evaluation. She has an extensive history of intermittent bouts of chronic urticaria. We did a large workup and the only thing that was positive for her anti-mast cell antibody panel. She has since started Xolair with good effects.   Since the last visit, she has done well. She is due for her second Xolair  injection next Wednesday. She remains on the Zyrtec and Pepcid BID. She has been hive free since that time and she is just floored at how well she is doing. She has not tried decreasing her dosing of her antihistamines. She has been able to put more foods into her diet. She had more gluten when she was traveling with her family on a trip to [facility] and she did not have any breakouts at all. She is planning to continue to avoid cow's milk, but she might try introducing other foods that she has been avoiding.   She is going on a cruise later this month with some friends. They are going to [place].   Otherwise, there have been no changes to her past medical history, surgical history, family history, or social history.    Review of systems otherwise negative other than that mentioned in the HPI.    Objective:   Blood pressure 122/70, pulse 80, temperature 98 F (36.7 C), temperature source Temporal, resp. rate 16, height 4' 11.84" (1.52 m), weight 135 lb 12.8 oz (61.6 kg), SpO2 97%. Body mass index is 26.66 kg/m.  Previous weight: 54.1 kg    Physical Exam Vitals reviewed.  Constitutional:      Appearance: She is well-developed.  HENT:     Head: Normocephalic and atraumatic.     Right Ear: Tympanic membrane, ear canal and external ear normal. No drainage, swelling or tenderness. Tympanic membrane  is not injected, scarred, erythematous, retracted or bulging.     Left Ear: Tympanic membrane, ear canal and external ear normal. No drainage, swelling or tenderness. Tympanic membrane is not injected, scarred, erythematous, retracted or bulging.     Nose: No nasal deformity, septal deviation, mucosal edema or rhinorrhea.     Right Sinus: No maxillary sinus tenderness or frontal sinus tenderness.     Left Sinus: No maxillary sinus tenderness or frontal sinus tenderness.     Mouth/Throat:     Mouth: Mucous membranes are not pale and not dry.     Pharynx: Uvula midline.  Eyes:      General:        Right eye: No discharge.        Left eye: No discharge.     Conjunctiva/sclera: Conjunctivae normal.     Right eye: Right conjunctiva is not injected. No chemosis.    Left eye: Left conjunctiva is not injected. No chemosis.    Pupils: Pupils are equal, round, and reactive to light.  Cardiovascular:     Rate and Rhythm: Normal rate and regular rhythm.     Heart sounds: Normal heart sounds.  Pulmonary:     Effort: Pulmonary effort is normal. No tachypnea, accessory muscle usage or respiratory distress.     Breath sounds: Normal breath sounds. No wheezing, rhonchi or rales.  Chest:     Chest wall: No tenderness.  Abdominal:     Tenderness: There is no abdominal tenderness. There is no guarding or rebound.  Lymphadenopathy:     Head:     Right side of head: No submandibular, tonsillar or occipital adenopathy.     Left side of head: No submandibular, tonsillar or occipital adenopathy.     Cervical: No cervical adenopathy.  Skin:    Coloration: Skin is not pale.     Findings: No abrasion, erythema, petechiae or rash. Rash is not papular, urticarial or vesicular.  Neurological:     Mental Status: She is alert.  Psychiatric:        Behavior: Behavior is cooperative.      Diagnostic studies: none       Malachi Bonds, MD  [facility]

## 2023-04-23 NOTE — Patient Instructions (Addendum)
1. 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  - Stop the Pepcid and just continue with Zyrtec twice daily. - We will discuss stopping the Zyrtec at the next visit. - Make sure you take your EpiPen with you all of the time. - Call with any problems.   2. Return in about 6 months (around 10/24/2023).   Please inform us of any Emergency Department visits, hospitalizations, or changes in symptoms. Call us before going to the ED for breathing or allergy symptoms since we might be able to fit you in for a sick visit. Feel free to contact us anytime with any questions, problems, or concerns.  It was a pleasure to see you today!  Websites that have reliable patient information: 1. American Academy of Asthma, Allergy, and Immunology: www.aaaai.org 2. Food Allergy Research and Education (FARE): foodallergy.org 3. Mothers of Asthmatics: http://www.asthmacommunitynetwork.org 4. American College of Allergy, Asthma, and Immunology: www.acaai.org   COVID-19 Vaccine Information can be found at: [email or web address] For questions related to vaccine distribution or appointments, please email [email or web address] or call [phone].   We realize that you might be concerned about having an allergic reaction to the COVID19 vaccines. To help with that concern, WE ARE OFFERING THE COVID19 VACCINES IN OUR OFFICE! Ask the front desk for dates!     "Like" [place] on Facebook and Instagram for our latest updates!      A healthy democracy works best when [employer] participate! Make sure you are registered to vote! If you have moved or changed any of your contact information, you will need to get this updated before voting!  In some cases, you MAY be able to register to vote online: [email or web address]

## 2023-04-28 ENCOUNTER — Ambulatory Visit: Payer: Self-pay

## 2023-04-28 DIAGNOSIS — L501 Idiopathic urticaria: Secondary | ICD-10-CM

## 2023-05-01 ENCOUNTER — Other Ambulatory Visit: Payer: Self-pay | Admitting: Family Medicine

## 2023-05-01 DIAGNOSIS — F410 Panic disorder [episodic paroxysmal anxiety] without agoraphobia: Secondary | ICD-10-CM

## 2023-05-26 ENCOUNTER — Other Ambulatory Visit: Payer: Self-pay | Admitting: *Deleted

## 2023-05-26 ENCOUNTER — Telehealth: Payer: Self-pay

## 2023-05-26 ENCOUNTER — Ambulatory Visit: Payer: Medicare PPO

## 2023-05-26 DIAGNOSIS — L501 Idiopathic urticaria: Secondary | ICD-10-CM

## 2023-05-26 MED ORDER — XOLAIR 300 MG/2ML ~~LOC~~ SOSY: 300.0000 mg | PREFILLED_SYRINGE | SUBCUTANEOUS | 3 refills | Status: DC

## 2023-05-26 NOTE — Telephone Encounter (Signed)
Patient came in for her last self administration of Xolair 300 mg. Patient would like Tammy to give her a call, she is wondering what pharmacy she needs to call to get refills shipped to her home. Patient is also wondering if she is able to get 3 month supply at a time or does it have to be one month at a time. Tammy please advise.

## 2023-05-26 NOTE — Telephone Encounter (Signed)
Spoke to patient and advised I have called [facility] and ok home admin and send new rx for 300mg  syringe for 90 day supply to them. Number given for ordering and instructions on delivery storage and dosing

## 2023-06-08 DIAGNOSIS — Z6826 Body mass index (BMI) 26.0-26.9, adult: Secondary | ICD-10-CM | POA: Diagnosis not present

## 2023-06-08 DIAGNOSIS — Z1231 Encounter for screening mammogram for malignant neoplasm of breast: Secondary | ICD-10-CM | POA: Diagnosis not present

## 2023-06-08 DIAGNOSIS — Z01419 Encounter for gynecological examination (general) (routine) without abnormal findings: Secondary | ICD-10-CM | POA: Diagnosis not present

## 2023-06-24 DIAGNOSIS — M79644 Pain in right finger(s): Secondary | ICD-10-CM | POA: Diagnosis not present

## 2023-06-24 DIAGNOSIS — S63286A Dislocation of proximal interphalangeal joint of right little finger, initial encounter: Secondary | ICD-10-CM | POA: Diagnosis not present

## 2023-06-25 DIAGNOSIS — S63286A Dislocation of proximal interphalangeal joint of right little finger, initial encounter: Secondary | ICD-10-CM | POA: Diagnosis not present

## 2023-07-01 DIAGNOSIS — M79644 Pain in right finger(s): Secondary | ICD-10-CM | POA: Diagnosis not present

## 2023-07-01 DIAGNOSIS — S63286A Dislocation of proximal interphalangeal joint of right little finger, initial encounter: Secondary | ICD-10-CM | POA: Diagnosis not present

## 2023-07-22 DIAGNOSIS — S63286A Dislocation of proximal interphalangeal joint of right little finger, initial encounter: Secondary | ICD-10-CM | POA: Diagnosis not present

## 2023-07-27 ENCOUNTER — Encounter: Payer: Self-pay | Admitting: Family Medicine

## 2023-07-29 ENCOUNTER — Telehealth: Payer: Self-pay | Admitting: Family Medicine

## 2023-07-29 NOTE — Telephone Encounter (Signed)
Sooner appt made for pt

## 2023-07-30 ENCOUNTER — Encounter: Payer: Self-pay | Admitting: Family Medicine

## 2023-07-30 ENCOUNTER — Telehealth: Payer: Medicare PPO | Admitting: Family Medicine

## 2023-07-30 ENCOUNTER — Encounter: Payer: Self-pay | Admitting: Allergy & Immunology

## 2023-07-30 DIAGNOSIS — Z79899 Other long term (current) drug therapy: Secondary | ICD-10-CM

## 2023-07-30 DIAGNOSIS — F410 Panic disorder [episodic paroxysmal anxiety] without agoraphobia: Secondary | ICD-10-CM

## 2023-07-30 DIAGNOSIS — L508 Other urticaria: Secondary | ICD-10-CM

## 2023-07-30 DIAGNOSIS — F411 Generalized anxiety disorder: Secondary | ICD-10-CM

## 2023-07-30 MED ORDER — ESCITALOPRAM OXALATE 10 MG PO TABS: ORAL_TABLET | ORAL | 3 refills | Status: DC

## 2023-07-30 MED ORDER — ALPRAZOLAM 0.5 MG PO TABS: 0.2500 mg | ORAL_TABLET | Freq: Every evening | ORAL | 1 refills | Status: DC | PRN

## 2023-07-30 MED ORDER — ESCITALOPRAM OXALATE 20 MG PO TABS: ORAL_TABLET | ORAL | 3 refills | Status: DC

## 2023-07-30 NOTE — Progress Notes (Signed)
MyChart Video visit  Subjective: CC:GAD PCP: Raliegh Ip, DO OZD:GUYQI K Clickner is a 67 y.o. female. Patient provides verbal consent for consult held via video.  Due to COVID-19 pandemic this visit was conducted virtually. This visit type was conducted due to national recommendations for restrictions regarding the COVID-19 Pandemic (e.g. social distancing, sheltering in place) in an effort to limit this patient's exposure and mitigate transmission in our community. All issues noted in this document were discussed and addressed.  A physical exam was not performed with this format.   Location of patient: car Location of provider: [facility] Others present for call: none  1.  Generalized anxiety disorder with panic attack Patient utilizes Lexapro.  She self weaned to 10 mg but given current state of things that she has kept herself on this rather than weaning off.  She is been off Topamax excessively with no issues.  Continues to use benzodiazepine fairly regularly.  No reports of excessive daytime sedation, falls, respiratory depression or visual or auditory hallucinations.  She notes that her daughter who lives in [place] had a baby recently who apparently was born without a patent anus.  She is undergoing surgery to reverse the colostomy soon.  She is been going back and forth to [place] to help with that baby and continues to help with Dr. Henreitta Leber sons as well.   ROS: Per HPI  Allergies  Allergen Reactions   Molds & Smuts Hives and Itching   Past Medical History:  Diagnosis Date   Allergy    Anxiety    Depression    GERD (gastroesophageal reflux disease)    Migraine    Urticaria     Current Outpatient Medications:    ALPRAZolam (XANAX) 0.5 MG tablet, Take 0.5-1 tablets (0.25-0.5 mg total) by mouth at bedtime as needed for anxiety., Disp: 90 tablet, Rfl: 1   cetirizine (ZYRTEC) 10 MG tablet, Take 1 tablet (10 mg total) by mouth 2 (two) times daily., Disp: 180 tablet, Rfl: 1    EPINEPHrine (EPIPEN 2-PAK) 0.3 mg/0.3 mL IJ SOAJ injection, Inject 0.3 mg into the muscle as needed for anaphylaxis., Disp: 1 each, Rfl: 0   escitalopram (LEXAPRO) 20 MG tablet, TAKE ONE (1) TABLET EACH DAY, Disp: 90 tablet, Rfl: 3   famotidine (PEPCID) 20 MG tablet, Take 1 tablet (20 mg total) by mouth 2 (two) times daily for 7 days., Disp: 14 tablet, Rfl: 0   omalizumab (XOLAIR) 300 MG/2  ML prefilled syringe, Inject 300 mg into the skin every 28 (twenty-eight) days., Disp: 6 mL, Rfl: 3   rizatriptan (MAXALT-MLT) 10 MG disintegrating tablet, PLACE 1 TABLET ON TONGUE AND ALLOW TO DISSOLVE AS NEEDED FOR ONSET OFHEADACHE, Disp: 12 tablet, Rfl: 5   Zinc Sulfate (ZINC 15 PO), Take by mouth., Disp: , Rfl:   Current Facility-Administered Medications:    omalizumab Geoffry Paradise) prefilled syringe 300 mg, 300 mg, Subcutaneous, Q28 days, Alfonse Spruce, MD, 300 mg at 05/26/23 0910  Gen: well appearing female, NAD Psych: mood stable, speech normal     07/30/2023   12:42 PM 12/16/2022    9:21 AM 07/24/2022   10:05 AM  Depression screen PHQ 2/9  Decreased Interest 0 0 0  Down, Depressed, Hopeless 0 0 0  PHQ - 2 Score 0 0 0  Altered sleeping  0   Tired, decreased energy  0   Change in appetite  0   Feeling bad or failure about yourself   0   Trouble concentrating  0   Moving slowly or fidgety/restless  0   Suicidal thoughts  0   PHQ-9 Score  0   Difficult doing work/chores  Not difficult at all     Assessment/ Plan: 67 y.o. female   Generalized anxiety disorder with panic attacks - Plan: ALPRAZolam (XANAX) 0.5 MG tablet, escitalopram (LEXAPRO) 10 MG tablet, DISCONTINUED: escitalopram (LEXAPRO) 20 MG tablet  Chronic use of benzodiazepine for therapeutic purpose  Chronic urticaria  Chronic and stable.  Up-to-date on UDS and CSC.  Lexapro changed to 10 mg to reflect current usage.  Continue benzodiazepine as needed as directed.  National narcotic database reviewed and there were no red  flags.  Will update MMSE at next visit as well as UDS and CSC  Having some flareup of urticaria.  Will CC allergy as FYI.  She will be reaching out to him as well  Start time: 12:33p End time: 12:41p  Total time spent on patient care (including video visit/ documentation): 9 minutes  Simona Rocque Hulen Skains, DO [facility] [phone]

## 2023-08-05 DIAGNOSIS — S63286D Dislocation of proximal interphalangeal joint of right little finger, subsequent encounter: Secondary | ICD-10-CM | POA: Diagnosis not present

## 2023-09-07 DIAGNOSIS — S63286D Dislocation of proximal interphalangeal joint of right little finger, subsequent encounter: Secondary | ICD-10-CM | POA: Diagnosis not present

## 2023-09-17 ENCOUNTER — Ambulatory Visit: Payer: Medicare PPO

## 2023-10-20 ENCOUNTER — Ambulatory Visit: Payer: Medicare PPO | Admitting: Family Medicine

## 2023-10-27 ENCOUNTER — Encounter: Payer: Self-pay | Admitting: Allergy & Immunology

## 2023-10-27 ENCOUNTER — Other Ambulatory Visit (HOSPITAL_COMMUNITY): Payer: Self-pay

## 2023-10-27 ENCOUNTER — Ambulatory Visit: Payer: Medicare PPO

## 2023-10-27 ENCOUNTER — Ambulatory Visit: Payer: Medicare PPO | Admitting: Allergy & Immunology

## 2023-10-27 ENCOUNTER — Other Ambulatory Visit: Payer: Self-pay | Admitting: *Deleted

## 2023-10-27 VITALS — BP 142/80 | HR 85 | Temp 97.3°F | Resp 18 | Wt 142.0 lb

## 2023-10-27 VITALS — Ht 60.0 in | Wt 135.0 lb

## 2023-10-27 DIAGNOSIS — T783XXD Angioneurotic edema, subsequent encounter: Secondary | ICD-10-CM | POA: Diagnosis not present

## 2023-10-27 DIAGNOSIS — Z0000 Encounter for general adult medical examination without abnormal findings: Secondary | ICD-10-CM

## 2023-10-27 DIAGNOSIS — L508 Other urticaria: Secondary | ICD-10-CM

## 2023-10-27 MED ORDER — XOLAIR 300 MG/2ML ~~LOC~~ SOSY: 300.0000 mg | PREFILLED_SYRINGE | SUBCUTANEOUS | 3 refills | Status: DC

## 2023-10-27 NOTE — Progress Notes (Signed)
Subjective:   Karen White is a 68 y.o. female who presents for an Initial Medicare Annual Wellness Visit.  Visit Complete: Virtual I connected with  Karen White on 10/27/23 by a audio enabled telemedicine application and verified that I am speaking with the correct person using two identifiers.  Patient Location: Home  Provider Location: Home Office  This patient declined Interactive audio and video telecommunications. Therefore the visit was completed with audio only.  I discussed the limitations of evaluation and management by telemedicine. The patient expressed understanding and agreed to proceed.  Vital Signs: Because this visit was a virtual/telehealth visit, some criteria may be missing or patient reported. Any vitals not documented were not able to be obtained and vitals that have been documented are patient reported.  Patient Medicare AWV questionnaire was completed by the patient on 10/23/23; I have confirmed that all information answered by patient is correct and no changes since this date.  Cardiac Risk Factors include: advanced age (>68men, >59 women);dyslipidemia     Objective:    Today's Vitals   10/27/23 0842  Weight: 135 lb (61.2 kg)  Height: 5' (1.524 m)   Body mass index is 26.37 kg/m.     10/27/2023    8:48 AM 11/26/2015    3:58 PM  Advanced Directives  Does Patient Have a Medical Advance Directive? No Yes  Type of Special educational needs teacher of [place];Living will  Would patient like information on creating a medical advance directive? Yes (MAU/Ambulatory/Procedural Areas - Information given)     Current Medications (verified) Outpatient Encounter Medications as of 10/27/2023  Medication Sig   ALPRAZolam (XANAX) 0.5 MG tablet Take 0.5-1 tablets (0.25-0.5 mg total) by mouth at bedtime as needed for anxiety.   cetirizine (ZYRTEC) 10 MG tablet Take 1 tablet (10 mg total) by mouth 2 (two) times daily.   EPINEPHrine (EPIPEN 2-PAK) 0.3  mg/0.3 mL IJ SOAJ injection Inject 0.3 mg into the muscle as needed for anaphylaxis.   escitalopram (LEXAPRO) 10 MG tablet TAKE ONE (1) TABLET EACH DAY. DOSE CHANGE   omalizumab (XOLAIR) 300 MG/2  ML prefilled syringe Inject 300 mg into the skin every 28 (twenty-eight) days.   rizatriptan (MAXALT-MLT) 10 MG disintegrating tablet PLACE 1 TABLET ON TONGUE AND ALLOW TO DISSOLVE AS NEEDED FOR ONSET OFHEADACHE   famotidine (PEPCID) 20 MG tablet Take 1 tablet (20 mg total) by mouth 2 (two) times daily for 7 days.   [DISCONTINUED] Zinc Sulfate (ZINC 15 PO) Take by mouth.   Facility-Administered Encounter Medications as of 10/27/2023  Medication   omalizumab Geoffry Paradise) prefilled syringe 300 mg    Allergies (verified) Molds & smuts   History: Past Medical History:  Diagnosis Date   Allergy    Anxiety    Depression    GERD (gastroesophageal reflux disease)    Migraine    Urticaria    Past Surgical History:  Procedure Laterality Date   mercury posion from fillings     Schwanoma tumor resection     TONGUE BIOPSY     Family History  Problem Relation Age of Onset   Coronary artery disease Mother        Age 48s   Cancer Mother    Coronary artery disease Father        Age 37s   Heart disease Father    Cancer Maternal Grandmother        ovarian   Colon cancer Neg Hx    Colon polyps Neg  Hx    Esophageal cancer Neg Hx    Rectal cancer Neg Hx    Stomach cancer Neg Hx    Allergic rhinitis Neg Hx    Angioedema Neg Hx    Asthma Neg Hx    Eczema Neg Hx    Urticaria Neg Hx    Social History   Socioeconomic History   Marital status: Single    Spouse name: Not on file   Number of children: Not on file   Years of education: Not on file   Highest education level: Not on file  Occupational History   Not on file  Tobacco Use   Smoking status: Former    Current packs/day: 0.00    Types: Cigarettes    Quit date: 01/06/1976    Years since quitting: 47.8   Smokeless tobacco: Never    Tobacco comments:    Not smoking now  Substance and Sexual Activity   Alcohol use: Yes    Alcohol/week: 2.0 standard drinks of alcohol    Types: 2 Glasses of wine per week    Comment: One drink a week   Drug use: No   Sexual activity: Not Currently    Birth control/protection: Post-menopausal  Other Topics Concern   Not on file  Social History Narrative   Not on file   Social Drivers of Health   Financial Resource Strain: Not on file  Food Insecurity: Not on file  Transportation Needs: Not on file  Physical Activity: Not on file  Stress: Not on file  Social Connections: Unknown (09/13/2023)   Social Connection and Isolation Panel [NHANES]    Frequency of Communication with Friends and Family: Not on file    Frequency of Social Gatherings with Friends and Family: Not on file    Attends Religious Services: 1 to 4 times per year    Active Member of [employer] or Organizations: Not on file    Attends Banker Meetings: Not on file    Marital Status: Not on file    Tobacco Counseling Counseling given: Not Answered Tobacco comments: Not smoking now   Clinical Intake:  Pre-visit preparation completed: Yes  Pain : No/denies pain     Diabetes: No  How often do you need to have someone help you when you read instructions, pamphlets, or other written materials from your doctor or pharmacy?: 1 - Never  Interpreter Needed?: No  Information entered by :: Kandis Fantasia LPN   Activities of Daily Living    10/23/2023    9:43 AM 09/13/2023    9:21 PM  In your present state of health, do you have any difficulty performing the following activities:  Hearing? 0 0  Vision? 0 0  Difficulty concentrating or making decisions? 0 0  Walking or climbing stairs? 0 0  Dressing or bathing? 0 0  Doing errands, shopping? 0 0  Preparing Food and eating ? N N  Using the Toilet? N N  In the past six months, have you accidently leaked urine? N N  Do you have problems with loss of  bowel control? N N  Managing your Medications? N N  Managing your Finances? N N  Housekeeping or managing your Housekeeping? N N    Patient Care Team: Raliegh Ip, DO as PCP - General (Family Medicine) [facility], [facility], Hetty Ely, MD as Consulting Physician (Allergy and Immunology)  Indicate any recent Medical Services you may have received from other than [facility] providers in  the past year (date may be approximate).     Assessment:   This is a routine wellness examination for Lyanna.  Hearing/Vision screen Hearing Screening - Comments:: Denies hearing difficulties     Goals Addressed             This Visit's Progress    Remain active and independent        Depression Screen    10/27/2023    8:45 AM 07/30/2023   12:42 PM 12/16/2022    9:21 AM 07/24/2022   10:05 AM 05/27/2022   11:20 AM 12/29/2021    9:40 AM 07/28/2021    9:04 AM  PHQ 2/9 Scores  PHQ - 2 Score 0 0 0 0 0 0 0  PHQ- 9 Score   0        Fall Risk    10/23/2023    9:43 AM 09/13/2023    9:21 PM 07/24/2022   10:05 AM 05/27/2022   11:20 AM 12/29/2021    9:40 AM  Fall Risk   Falls in the past year? 1 1 0 0 0  Number falls in past yr: 0 0     Injury with Fall? 0 1       MEDICARE RISK AT HOME: Medicare Risk at Home Any stairs in or around the home?: (Patient-Rptd) Yes If so, are there any without handrails?: (Patient-Rptd) No Home free of loose throw rugs in walkways, pet beds, electrical cords, etc?: (Patient-Rptd) Yes Adequate lighting in your home to reduce risk of falls?: (Patient-Rptd) Yes Life alert?: (Patient-Rptd) No Use of a cane, walker or w/c?: (Patient-Rptd) No Grab bars in the bathroom?: (Patient-Rptd) No Shower chair or bench in shower?: (Patient-Rptd) No Elevated toilet seat or a handicapped toilet?: (Patient-Rptd) No  TIMED UP AND GO:  Was the test performed? No    Cognitive Function:        10/27/2023    8:48 AM  6CIT Screen  What Year? 0  points  What month? 0 points  What time? 0 points  Count back from 20 0 points  Months in reverse 0 points  Repeat phrase 0 points  Total Score 0 points    Immunizations Immunization History  Administered Date(s) Administered   Influenza-Unspecified 08/06/2020, 08/25/2020   PFIZER(Purple Top)SARS-COV-2 Vaccination 12/03/2019, 12/30/2019, 08/01/2020   Tdap 04/19/2014   Unspecified SARS-COV-2 Vaccination 03/14/2021    TDAP status: Up to date  Flu Vaccine status: Declined, Education has been provided regarding the importance of this vaccine but patient still declined. Advised may receive this vaccine at local pharmacy or Health Dept. Aware to provide a copy of the vaccination record if obtained from local pharmacy or Health Dept. Verbalized acceptance and understanding.  Pneumococcal vaccine status: Declined,  Education has been provided regarding the importance of this vaccine but patient still declined. Advised may receive this vaccine at local pharmacy or Health Dept. Aware to provide a copy of the vaccination record if obtained from local pharmacy or Health Dept. Verbalized acceptance and understanding.   Covid-19 vaccine status: Declined, Education has been provided regarding the importance of this vaccine but patient still declined. Advised may receive this vaccine at local pharmacy or Health Dept.or vaccine clinic. Aware to provide a copy of the vaccination record if obtained from local pharmacy or Health Dept. Verbalized acceptance and understanding.  Qualifies for Shingles Vaccine? Yes   Zostavax completed No   Shingrix Completed?: No.    Education has been provided regarding the importance of this vaccine.  Patient has been advised to call insurance company to determine out of pocket expense if they have not yet received this vaccine. Advised may also receive vaccine at local pharmacy or Health Dept. Verbalized acceptance and understanding.  Screening Tests Health Maintenance   Topic Date Due   Hepatitis C Screening  Never done   Zoster Vaccines- Shingrix (1 of 2) Never done   Pneumonia Vaccine 98+ Years old (1 of 1 - PCV) Never done   INFLUENZA VACCINE  05/06/2023   COVID-19 Vaccine (5 - 2024-25 season) 06/06/2023   DTaP/Tdap/Td (2 - Td or Tdap) 04/19/2024   DEXA SCAN  06/01/2024   Medicare Annual Wellness (AWV)  10/26/2024   MAMMOGRAM  06/07/2025   Colonoscopy  12/09/2025   HPV VACCINES  Aged Out    Health Maintenance  Health Maintenance Due  Topic Date Due   Hepatitis C Screening  Never done   Zoster Vaccines- Shingrix (1 of 2) Never done   Pneumonia Vaccine 52+ Years old (1 of 1 - PCV) Never done   INFLUENZA VACCINE  05/06/2023   COVID-19 Vaccine (5 - 2024-25 season) 06/06/2023    Colorectal cancer screening: Type of screening: Colonoscopy. Completed 12/10/15. Repeat every 10 years  Mammogram status: Completed 06/08/23. Repeat every year  Bone Density status: Completed 06/01/22. Results reflect: Bone density results: OSTEOPENIA. Repeat every 2 years.  Lung Cancer Screening: (Low Dose CT Chest recommended if Age 43-80 years, 20 pack-year currently smoking OR have quit w/in 15years.) does not qualify.   Lung Cancer Screening Referral: n/a  Additional Screening:  Hepatitis C Screening: does qualify  Vision Screening: Recommended annual ophthalmology exams for early detection of glaucoma and other disorders of the eye. Is the patient up to date with their annual eye exam?  Yes  Who is the provider or what is the name of the office in which the patient attends annual eye exams? Dr. Conley Rolls  If pt is not established with a provider, would they like to be referred to a provider to establish care? No .   Dental Screening: Recommended annual dental exams for proper oral hygiene  Community Resource Referral / Chronic Care Management: CRR required this visit?  No   CCM required this visit?  No     Plan:     I have personally reviewed and noted the  following in the patient's chart:   Medical and social history Use of alcohol, tobacco or illicit drugs  Current medications and supplements including opioid prescriptions. Patient is not currently taking opioid prescriptions. Functional ability and status Nutritional status Physical activity Advanced directives List of other physicians Hospitalizations, surgeries, and ER visits in previous 12 months Vitals Screenings to include cognitive, depression, and falls Referrals and appointments  In addition, I have reviewed and discussed with patient certain preventive protocols, quality metrics, and best practice recommendations. A written personalized care plan for preventive services as well as general preventive health recommendations were provided to patient.     Kandis Fantasia [place], [place]   [id number]   After Visit Summary: (MyChart) Due to this being a telephonic visit, the after visit summary with patients personalized plan was offered to patient via MyChart   Nurse Notes: No concerns at this time

## 2023-10-27 NOTE — Patient Instructions (Addendum)
1. 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 - We are going to increase the dose every 14 days to see if this helps prevent breakthrough reactions. - Continue with the Pepcid and Zyrtec twice daily. - Make sure you take your EpiPen with you all of the time. - [employer] recently introduce pea protein into their waffle fries, so I wonder if that is related to your outbreaks.  - Unfortunately, we cannot do testing while you are on the Xolair since this messes up with the testing. - We are going to do some testing look too for hereditary angioedema, which I think is unlikely.  2. Return in about 6 months (around 04/25/2024). You can have the follow up appointment with Dr. Dellis Anes or a Nurse Practicioner (our Nurse Practitioners are excellent and always have Physician oversight!).    Please inform us of any Emergency Department visits, hospitalizations, or changes in symptoms. Call us before going to the ED for breathing or allergy symptoms since we might be able to fit you in for a sick visit. Feel free to contact us anytime with any questions, problems, or concerns.  It was a pleasure to see you again today!  Websites that have reliable patient information: 1. American Academy of Asthma, Allergy, and Immunology: www.aaaai.org 2. Food Allergy Research and Education (FARE): foodallergy.org 3. Mothers of Asthmatics: http://www.asthmacommunitynetwork.org 4. American College of Allergy, Asthma, and Immunology: www.acaai.org      "Like" [place] on Facebook and Instagram for our latest updates!      A healthy democracy works best when [employer] participate! Make sure you are registered to vote! If you have moved or changed any of your contact information, you will need to get this updated before voting! Scan the QR codes below to learn more!

## 2023-10-27 NOTE — Patient Instructions (Signed)
Karen White , Thank you for taking time to come for your Medicare Wellness Visit. I appreciate your ongoing commitment to your health goals. Please review the following plan we discussed and let me know if I can assist you in the future.   Referrals/Orders/Follow-Ups/Clinician Recommendations: Aim for 30 minutes of exercise or brisk walking, 6-8 glasses of water, and 5 servings of fruits and vegetables each day.  This is a list of the screening recommended for you and due dates:  Health Maintenance  Topic Date Due   Hepatitis C Screening  Never done   Zoster (Shingles) Vaccine (1 of 2) Never done   Pneumonia Vaccine (1 of 1 - PCV) Never done   Flu Shot  05/06/2023   COVID-19 Vaccine (5 - 2024-25 season) 06/06/2023   DTaP/Tdap/Td vaccine (2 - Td or Tdap) 04/19/2024   DEXA scan (bone density measurement)  06/01/2024   Medicare Annual Wellness Visit  10/26/2024   Mammogram  06/07/2025   Colon Cancer Screening  12/09/2025   HPV Vaccine  Aged Out    Advanced directives: (ACP Link)Information on Advanced Care Planning can be found at [facility] Advance Health Care Directives Advance Health Care Directives ([email or web address]   Next Medicare Annual Wellness Visit scheduled for next year: Yes

## 2023-10-27 NOTE — Progress Notes (Signed)
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 - We are going to increase the dose every 14 days to see if this helps prevent breakthrough reactions. - Continue with the Pepcid and Zyrtec twice daily. - Make sure you take your EpiPen with you all of the time. - [employer] recently introduce pea protein into their waffle fries, so I wonder if that is related to your outbreaks.  - Unfortunately, we cannot do testing while you are on the Xolair since this messes up with the testing. - We are going to do some testing look too for hereditary angioedema, which I think is unlikely.  2. Return in about 6 months (around 04/25/2024). You can have the follow up appointment with Dr. Dellis Anes or a Nurse Practicioner (our Nurse Practitioners are excellent and always have Physician oversight!).   Subjective:   Karen White is a 68 y.o. female presenting today for follow up of  Chief Complaint  Patient presents with   Follow-up    Still having some hive flares- Xolair has helped     Karen White has a history of the following: Patient Active Problem List   Diagnosis Date Noted   Chronic urticaria 03/02/2023   Weight loss 03/02/2023   Pure hypercholesterolemia 12/31/2020   Generalized anxiety disorder with panic attacks 02/21/2020   Migraine 09/11/2018   Acute bronchitis 09/21/2016   Menopause 09/21/2016   Lipoma of back 01/06/2012   Precordial pain 12/14/2011   Elevated blood pressure  12/14/2011   Cardiac murmur 12/14/2011   Cervicalgia 08/21/2011   ESOPHAGEAL STRICTURE 08/12/2010   ESOPHAGEAL REFLUX 07/28/2010   DYSPHAGIA UNSPECIFIED 07/28/2010   DIARRHEA 07/28/2010    History obtained from: chart review and patient.  Discussed the use of AI scribe software for clinical note transcription with the patient and/or guardian, who gave verbal consent to proceed.  Karen White is a 68 y.o. female presenting for a follow up visit.  She was last seen in July 2024.  At that time, she was doing very well on her Xolair every 28 days.  We recommended that she stop the Pepcid and just continue with Zyrtec twice daily.  We did remind her to have her EpiPen with her at all times.  Since last visit, she presented with intermittent episodes of swelling, primarily affecting the lips and eyes. She reports a recent episode of swelling this morning, which she attributes to consumption of beans and cornbread the previous night. She also describes the swelling as a minor discomfort, which she manages with Zyrtec. She notes that the swelling does not completely resolve, leaving a residual sensation of discomfort.  She reports a history of similar episodes, which she describes as "breakthroughs." The frequency and triggers of these episodes are unclear, but the patient suspects a possible link to dietary intake. She mentions a recent incident of hives after eating at [employer], despite no known changes in her usual order. She also reports a possible reaction to corn  chowder at [employer], suspecting it may have contained onions, which she typically avoids. Of note, she was not aware of the change of addition of pea protein to the [employer] fries.   Despite these episodes, she reports a significant improvement in her quality of life since starting Xolair injections, administered once every 28 days. She notes that the episodes of swelling are less severe and less frequent than before starting the  medication. However, she expresses frustration at the unpredictability of these episodes, as she has not identified any consistent triggers.  She reports taking Pepcid and Zyrtec as needed, particularly when she anticipates a potential allergic reaction. She mentions a possible link between her symptoms and consumption of iced tea, but this connection remains uncertain. She expresses a willingness to increase the frequency of Xolair injections to further manage her symptoms.     There is no history of swelling in other family members.   Otherwise, there have been no changes to her past medical history, surgical history, family history, or social history.    Review of systems otherwise negative other than that mentioned in the HPI.    Objective:   Blood pressure (!) 142/80, pulse 85, temperature (!) 97.3 F (36.3 C), resp. rate 18, weight 142 lb (64.4 kg), SpO2 98%. Body mass index is 27.73 kg/m.    Physical Exam Vitals reviewed.  Constitutional:      Appearance: She is well-developed.  HENT:     Head: Normocephalic and atraumatic.     Right Ear: Tympanic membrane, ear canal and external ear normal. No drainage, swelling or tenderness. Tympanic membrane is not injected, scarred, erythematous, retracted or bulging.     Left Ear: Tympanic membrane, ear canal and external ear normal. No drainage, swelling or tenderness. Tympanic membrane is not injected, scarred, erythematous, retracted or bulging.     Nose: No nasal deformity, septal deviation, mucosal edema or rhinorrhea.     Right Turbinates: Enlarged and swollen.     Left Turbinates: Enlarged and swollen.     Right Sinus: No maxillary sinus tenderness or frontal sinus tenderness.     Left Sinus: No maxillary sinus tenderness or frontal sinus tenderness.     Mouth/Throat:     Mouth: Mucous membranes are not pale and not dry.     Pharynx: Uvula midline.     Comments: She does have some swelling of her lower lip.  Eyes:      General:        Right eye: No discharge.        Left eye: No discharge.     Conjunctiva/sclera: Conjunctivae normal.     Right eye: Right conjunctiva is not injected. No chemosis.    Left eye: Left conjunctiva is not injected. No chemosis.    Pupils: Pupils are equal, round, and reactive to light.  Cardiovascular:     Rate and Rhythm: Normal rate and regular rhythm.     Heart sounds: Normal heart sounds.  Pulmonary:     Effort: Pulmonary effort is normal. No tachypnea, accessory muscle usage or respiratory distress.     Breath sounds: Normal breath sounds. No wheezing, rhonchi or rales.  Chest:     Chest wall: No tenderness.  Abdominal:     Tenderness: There is no abdominal tenderness. There is no guarding or rebound.  Lymphadenopathy:     Head:     Right side of head: No submandibular, tonsillar or occipital adenopathy.     Left side of  head: No submandibular, tonsillar or occipital adenopathy.     Cervical: No cervical adenopathy.  Skin:    Coloration: Skin is not pale.     Findings: No abrasion, erythema, petechiae or rash. Rash is not papular, urticarial or vesicular.  Neurological:     Mental Status: She is alert.  Psychiatric:        Behavior: Behavior is cooperative.      Diagnostic studies: labs sent instead        Malachi Bonds, MD  [facility]

## 2023-11-08 ENCOUNTER — Encounter: Payer: Self-pay | Admitting: Allergy & Immunology

## 2023-11-09 ENCOUNTER — Encounter: Payer: Self-pay | Admitting: *Deleted

## 2023-11-09 LAB — C1 ESTERASE INHIBITOR, FUNCTIONAL: C1INH Functional/C1INH Total MFr SerPl: >110 %{normal}

## 2023-11-09 LAB — C3 AND C4
Complement C3, Serum: 150 mg/dL (ref 82–167)
Complement C4, Serum: 33 mg/dL (ref 12–38)

## 2023-11-09 LAB — C1 ESTERASE INHIBITOR: C1INH SerPl-mCnc: 36 mg/dL (ref 21–39)

## 2023-11-09 LAB — COMPLEMENT COMPONENT C1Q: Complement C1Q: 17.2 mg/dL (ref 10.3–20.5)

## 2023-11-12 DIAGNOSIS — R059 Cough, unspecified: Secondary | ICD-10-CM | POA: Diagnosis not present

## 2023-11-12 DIAGNOSIS — J209 Acute bronchitis, unspecified: Secondary | ICD-10-CM | POA: Diagnosis not present

## 2023-11-12 DIAGNOSIS — R070 Pain in throat: Secondary | ICD-10-CM | POA: Diagnosis not present

## 2023-11-12 DIAGNOSIS — J111 Influenza due to unidentified influenza virus with other respiratory manifestations: Secondary | ICD-10-CM | POA: Diagnosis not present

## 2023-11-12 DIAGNOSIS — Z20822 Contact with and (suspected) exposure to covid-19: Secondary | ICD-10-CM | POA: Diagnosis not present

## 2024-01-18 ENCOUNTER — Encounter: Payer: Self-pay | Admitting: Family Medicine

## 2024-01-18 ENCOUNTER — Ambulatory Visit: Payer: Medicare PPO | Admitting: Family Medicine

## 2024-01-18 VITALS — BP 132/84 | HR 80 | Ht 60.0 in | Wt 139.0 lb

## 2024-01-18 DIAGNOSIS — F411 Generalized anxiety disorder: Secondary | ICD-10-CM | POA: Diagnosis not present

## 2024-01-18 DIAGNOSIS — Z79899 Other long term (current) drug therapy: Secondary | ICD-10-CM | POA: Diagnosis not present

## 2024-01-18 DIAGNOSIS — K2100 Gastro-esophageal reflux disease with esophagitis, without bleeding: Secondary | ICD-10-CM | POA: Diagnosis not present

## 2024-01-18 DIAGNOSIS — E7800 Pure hypercholesterolemia, unspecified: Secondary | ICD-10-CM | POA: Diagnosis not present

## 2024-01-18 DIAGNOSIS — Z713 Dietary counseling and surveillance: Secondary | ICD-10-CM | POA: Diagnosis not present

## 2024-01-18 DIAGNOSIS — Z0001 Encounter for general adult medical examination with abnormal findings: Secondary | ICD-10-CM | POA: Diagnosis not present

## 2024-01-18 DIAGNOSIS — G43009 Migraine without aura, not intractable, without status migrainosus: Secondary | ICD-10-CM | POA: Diagnosis not present

## 2024-01-18 DIAGNOSIS — Z2821 Immunization not carried out because of patient refusal: Secondary | ICD-10-CM | POA: Diagnosis not present

## 2024-01-18 DIAGNOSIS — L509 Urticaria, unspecified: Secondary | ICD-10-CM

## 2024-01-18 DIAGNOSIS — Z0000 Encounter for general adult medical examination without abnormal findings: Secondary | ICD-10-CM

## 2024-01-18 DIAGNOSIS — F410 Panic disorder [episodic paroxysmal anxiety] without agoraphobia: Secondary | ICD-10-CM | POA: Diagnosis not present

## 2024-01-18 MED ORDER — RIZATRIPTAN BENZOATE 10 MG PO TBDP
ORAL_TABLET | ORAL | 5 refills | Status: AC
Start: 2024-01-18 — End: ?

## 2024-01-18 MED ORDER — FAMOTIDINE 20 MG PO TABS: 20.0000 mg | ORAL_TABLET | Freq: Two times a day (BID) | ORAL | 3 refills | Status: DC

## 2024-01-18 MED ORDER — ALPRAZOLAM 0.5 MG PO TABS: 0.2500 mg | ORAL_TABLET | Freq: Every evening | ORAL | 1 refills | Status: DC | PRN

## 2024-01-18 NOTE — Patient Instructions (Signed)
 GERD in Adults: Diet Changes When you have gastroesophageal reflux disease (GERD), you may need to make changes to your diet. Choosing the right foods can help with your symptoms. Think about working with an expert in healthy eating called a dietitian. They can help you make healthy food choices. What are tips for following this plan? Reading food labels Look for foods that are low in saturated fat. Foods that may help with your symptoms include: Foods with less than 5% of daily value (DV) of fat. Foods with 0 grams of trans fat. Cooking [employer] in ways that don't use a lot of fat. These ways include: Baking. Steaming. Grilling. Broiling. To add flavor, try to use herbs that are low in spice and acidity. Avoid frying your food. Meal planning  Eat small meals often rather than eating 3 large meals each day. Eat your meals slowly in a place where you feel relaxed. If told by your health care provider, avoid: Foods that cause symptoms. Keep a food diary to keep track of foods that cause symptoms. Alcohol. Drinking a lot of liquid with meals. General instructions For 2-3 hours after you eat, avoid: Bending over. Exercise. Lying down. Chew sugar-free gum after meals. What foods should I eat? Eat a healthy diet. Try to include: Foods with high amounts of fiber. These include: Fruits and vegetables. Whole grains and beans. Low-fat dairy products. Lean meats, fish, and poultry. Egg whites. Foods that cause symptoms in someone else may not cause symptoms for you. Work with your provider to find foods that are safe for you. The items listed above may not be all the foods and drinks you can have. Talk with a dietitian to learn more. The items listed above may not be a complete list of foods and beverages you can eat and drink. Contact a dietitian for more information. What foods should I avoid? Limiting some of these foods may help with your symptoms. Each person is different.  Talk with a dietitian or your provider to help you find the exact foods to avoid. Some of the foods to avoid may include: Fruits Fruits with a lot of acid in them. These may include citrus fruits, such as oranges, grapefruit, pineapple, and lemons. Vegetables Deep-fried vegetables, such as [place] fries. Vegetables, sauces, or toppings made with added fat and vegetables with acid in them. These may include tomatoes and tomato products, chili peppers, onions, garlic, and horseradish. Grains Pastries or quick breads with added fat. Meats and other proteins High-fat meats, such as fatty beef or pork, hot dogs, ribs, ham, sausage, salami, and bacon. Fried meat or protein, such as fried fish and fried chicken. Egg yolks. Fats and oils Butter. Margarine. Shortening. Ghee. Drinks Coffee and other drinks with caffeine in them. Fizzy and sugary drinks, such as soda and energy drinks. Fruit juice made with acidic fruits, such as orange or grapefruit. Tomato juice. Sweets and desserts Chocolate and cocoa. Donuts. Seasonings and condiments Mint, such as peppermint and spearmint. Condiments, herbs, or seasonings that cause symptoms. These may include curry, hot sauce, or vinegar-based salad dressings. The items listed above may not be all the foods and drinks you should avoid. Talk with a dietitian to learn more. Questions to ask your health care provider Changes to your diet and everyday life are often the first steps taken to manage symptoms of GERD. If these changes don't help, talk with your provider about taking medicines. Where to find more information International Foundation for Gastrointestinal Disorders:  aboutgerd.org This information is not intended to replace advice given to you by your health care provider. Make sure you discuss any questions you have with your health care provider. Document Revised: 08/03/2023 Document Reviewed: 02/17/2023 Elsevier Patient Education  2024 [employer].

## 2024-01-18 NOTE — Progress Notes (Signed)
 Karen White is a 68 y.o. female presents to office today for annual physical exam examination.    Concerns today include: 1.  Weight gain She reports that she has gained some weight and this worries her.  She is eating better because she has now been on Xolair and her hives and urticaria have reduced greatly.  She does have a slightly swollen upper lip today and notes that she ate out yesterday and had a chicken salad.  She worries that maybe there is some cross-contamination.  She still tries to really be careful about what she eats but she is able to expand what she eats a little more than what she had been.  She does voice some frustration because she still does not know the actual etiology of her chronic urticaria and she is very curious as to know what causes it.  She is exercising regularly, walking several miles per week and working out at the [employer] weightlifting etc.  She reports increased acid reflux but is not sure if this is related to medications or what.  Occupation: working some Systems developer at [employer], Marital status: single, Substance use: none Health Maintenance Due  Topic Date Due   Hepatitis C Screening  Never done   Zoster Vaccines- Shingrix (1 of 2) Never done   Pneumonia Vaccine 19+ Years old (1 of 1 - PCV) Never done   COVID-19 Vaccine (5 - 2024-25 season) 06/06/2023   Refills needed today: none  Immunization History  Administered Date(s) Administered   Influenza-Unspecified 08/06/2020, 08/25/2020   PFIZER(Purple Top)SARS-COV-2 Vaccination 12/03/2019, 12/30/2019, 08/01/2020   Tdap 04/19/2014   Unspecified SARS-COV-2 Vaccination 03/14/2021   Past Medical History:  Diagnosis Date   Allergy    Anxiety    Depression    GERD (gastroesophageal reflux disease)    Migraine    Urticaria    Social History   Socioeconomic History   Marital status: Single    Spouse name: Not on file   Number of children: Not on file   Years of education: Not on  file   Highest education level: Bachelor's degree (e.g., BA, AB, BS)  Occupational History   Not on file  Tobacco Use   Smoking status: Former    Current packs/day: 0.00    Types: Cigarettes    Quit date: 01/06/1976    Years since quitting: 48.0   Smokeless tobacco: Never   Tobacco comments:    Not smoking now  Substance and Sexual Activity   Alcohol use: Yes    Alcohol/week: 2.0 standard drinks of alcohol    Types: 2 Glasses of wine per week    Comment: One drink a week   Drug use: No   Sexual activity: Not Currently    Birth control/protection: Post-menopausal  Other Topics Concern   Not on file  Social History Narrative   Not on file   Social Drivers of Health   Financial Resource Strain: Low Risk  (01/14/2024)   Overall Financial Resource Strain (CARDIA)    Difficulty of Paying Living Expenses: Not hard at all  Food Insecurity: No Food Insecurity (01/14/2024)   Hunger Vital Sign    Worried About Running Out of Food in the Last Year: Never true    Ran Out of Food in the Last Year: Never true  Transportation Needs: No Transportation Needs (01/14/2024)   PRAPARE - Administrator, Civil Service (Medical): No    Lack of Transportation (Non-Medical): No  Physical Activity: Sufficiently Active (01/14/2024)   Exercise Vital Sign    Days of Exercise per Week: 4 days    Minutes of Exercise per Session: 60 min  Stress: No Stress Concern Present (01/14/2024)   [employer] of Occupational Health - Occupational Stress Questionnaire    Feeling of Stress : Only a little  Social Connections: Moderately Integrated (01/14/2024)   Social Connection and Isolation Panel [NHANES]    Frequency of Communication with Friends and Family: More than three times a week    Frequency of Social Gatherings with Friends and Family: Three times a week    Attends Religious Services: More than 4 times per year    Active Member of Clubs or Organizations: Yes    Attends Banker  Meetings: More than 4 times per year    Marital Status: Divorced  Intimate Partner Violence: Not At Risk (10/27/2023)   Humiliation, Afraid, Rape, and Kick questionnaire    Fear of Current or Ex-Partner: No    Emotionally Abused: No    Physically Abused: No    Sexually Abused: No   Past Surgical History:  Procedure Laterality Date   mercury posion from fillings     Schwanoma tumor resection     TONGUE BIOPSY     Family History  Problem Relation Age of Onset   Coronary artery disease Mother        Age 67s   Cancer Mother    Coronary artery disease Father        Age 90s   Heart disease Father    Cancer Maternal Grandmother        ovarian   Colon cancer Neg Hx    Colon polyps Neg Hx    Esophageal cancer Neg Hx    Rectal cancer Neg Hx    Stomach cancer Neg Hx    Allergic rhinitis Neg Hx    Angioedema Neg Hx    Asthma Neg Hx    Eczema Neg Hx    Urticaria Neg Hx     Current Outpatient Medications:    ALPRAZolam (XANAX) 0.5 MG tablet, Take 0.5-1 tablets (0.25-0.5 mg total) by mouth at bedtime as needed for anxiety., Disp: 90 tablet, Rfl: 1   cetirizine (ZYRTEC) 10 MG tablet, Take 1 tablet (10 mg total) by mouth 2 (two) times daily., Disp: 180 tablet, Rfl: 1   EPINEPHrine (EPIPEN 2-PAK) 0.3 mg/0.3 mL IJ SOAJ injection, Inject 0.3 mg into the muscle as needed for anaphylaxis., Disp: 1 each, Rfl: 0   escitalopram (LEXAPRO) 10 MG tablet, TAKE ONE (1) TABLET EACH DAY. DOSE CHANGE, Disp: 90 tablet, Rfl: 3   famotidine (PEPCID) 20 MG tablet, Take 1 tablet (20 mg total) by mouth 2 (two) times daily for 7 days., Disp: 14 tablet, Rfl: 0   omalizumab (XOLAIR) 300 MG/2  ML prefilled syringe, Inject 300 mg into the skin every 14 (fourteen) days., Disp: 12 mL, Rfl: 3   rizatriptan (MAXALT-MLT) 10 MG disintegrating tablet, PLACE 1 TABLET ON TONGUE AND ALLOW TO DISSOLVE AS NEEDED FOR ONSET OFHEADACHE, Disp: 12 tablet, Rfl: 5  Current Facility-Administered Medications:    omalizumab Geoffry Paradise)  prefilled syringe 300 mg, 300 mg, Subcutaneous, Q28 days, Alfonse Spruce, MD, 300 mg at 05/26/23 2130  Allergies  Allergen Reactions   Molds & Smuts Hives and Itching     ROS: Review of Systems Pertinent items noted in HPI and remainder of comprehensive ROS otherwise negative.    Physical exam BP 132/84  Pulse 80   Ht 5' (1.524 m)   Wt 139 lb (63 kg)   SpO2 96%   BMI 27.15 kg/m  General appearance: alert, cooperative, appears stated age, and no distress Head: Normocephalic, without obvious abnormality, atraumatic Eyes: negative findings: lids and lashes normal, conjunctivae and sclerae normal, corneas clear, and pupils equal, round, reactive to light and accomodation Ears: normal TM's and external ear canals both ears Nose: Nares normal. Septum midline. Mucosa normal. No drainage or sinus tenderness. Throat: She does have slight swelling of the upper lip.  Mucosa, and tongue normal; teeth and gums normal Neck: no adenopathy, supple, symmetrical, trachea midline, and thyroid not enlarged, symmetric, no tenderness/mass/nodules Back: symmetric, no curvature. ROM normal. No CVA tenderness. Lungs: clear to auscultation bilaterally Heart: regular rate and rhythm, S1, S2 normal, no murmur, click, rub or gallop Abdomen: soft, non-tender; bowel sounds normal; no masses,  no organomegaly Extremities: extremities normal, atraumatic, no cyanosis or edema Pulses: 2+ and symmetric Skin: Skin color, texture, turgor normal. No rashes or lesions Lymph nodes: Cervical, supraclavicular, and axillary nodes normal. Neurologic: Grossly normal      10/27/2023    8:45 AM 07/30/2023   12:42 PM 12/16/2022    9:21 AM  Depression screen PHQ 2/9  Decreased Interest 0 0 0  Down, Depressed, Hopeless 0 0 0  PHQ - 2 Score 0 0 0  Altered sleeping   0  Tired, decreased energy   0  Change in appetite   0  Feeling bad or failure about yourself    0  Trouble concentrating   0  Moving slowly or  fidgety/restless   0  Suicidal thoughts   0  PHQ-9 Score   0  Difficult doing work/chores   Not difficult at all      12/16/2022    9:21 AM 07/24/2022   10:05 AM 12/29/2021    9:40 AM 07/28/2021    9:02 AM  GAD 7 : Generalized Anxiety Score  Nervous, Anxious, on Edge 0 0 0 0  Control/stop worrying 0 0 0 0  Worry too much - different things 0 0 0 0  Trouble relaxing 0 0 0 0  Restless 0 0 0 0  Easily annoyed or irritable 0 0 0 0  Afraid - awful might happen 0 0 0 0  Total GAD 7 Score 0 0 0 0  Anxiety Difficulty Not difficult at all Not difficult at all Not difficult at all Not difficult at all     Assessment/ Plan: Karen White here for annual physical exam.   Annual physical exam  Gastroesophageal reflux disease with esophagitis without hemorrhage - Plan: H. pylori antigen, stool  Generalized anxiety disorder with panic attacks - Plan: ToxASSURE Select 13 (MW), Urine, ALPRAZolam (XANAX) 0.5 MG tablet  Controlled substance agreement signed - Plan: ToxASSURE Select 13 (MW), Urine  Migraine without aura and without status migrainosus, not intractable - Plan: CMP14+EGFR, CBC, rizatriptan (MAXALT-MLT) 10 MG disintegrating tablet  Pure hypercholesterolemia - Plan: CMP14+EGFR, Lipid Panel  Restricted diet - Plan: CMP14+EGFR, CBC, VITAMIN D 25 Hydroxy (Vit-D Deficiency, Fractures), Vitamin B12  Hives - Plan: famotidine (PEPCID) 20 MG tablet  I am going to check her for H. pylori given recent international travel on a cruise ship.  While I could not find anything specifically in our pharmacologic database with Xolair in relation to GERD, I did find some retrospective reports about increased acid reflux in persons over 60 on this particular medication.  She  may simply need to increase frequency of use of her Pepcid versus we can add a PPI.  Discussed avoiding acidic triggers  Alprazolam renewed.  UDS and CSA were updated as per office policy and the national narcotic database was  reviewed with no red flags.  We will plan to do MMSE at next visit as much of our time was spent talking about urticaria and other life happenings  Her migraines are chronic and stable no renewed her medications.  Check renal function, liver enzymes and fasting lipid panel  Check vitamin D, B12, CBC and electrolytes given restricted diet in the setting of allergy as above  Counseled on healthy lifestyle choices, including diet (rich in fruits, vegetables and lean meats and low in salt and simple carbohydrates) and exercise (at least 30 minutes of moderate physical activity daily).  Patient to follow up 24m for mood  Maxwell Lemen M. Bonnell Butcher, DO

## 2024-01-19 ENCOUNTER — Encounter: Payer: Self-pay | Admitting: Family Medicine

## 2024-01-19 LAB — CBC
Hematocrit: 44.9 % (ref 34.0–46.6)
Hemoglobin: 14.5 g/dL (ref 11.1–15.9)
MCH: 28.8 pg (ref 26.6–33.0)
MCHC: 32.3 g/dL (ref 31.5–35.7)
MCV: 89 fL (ref 79–97)
Platelets: 220 10*3/uL (ref 150–450)
RBC: 5.04 x10E6/uL (ref 3.77–5.28)
RDW: 14.5 % (ref 11.7–15.4)
WBC: 6.2 10*3/uL (ref 3.4–10.8)

## 2024-01-19 LAB — VITAMIN B12: Vitamin B-12: 728 pg/mL (ref 232–1245)

## 2024-01-19 LAB — CMP14+EGFR
ALT: 16 IU/L (ref 0–32)
AST: 20 IU/L (ref 0–40)
Albumin: 4.3 g/dL (ref 3.9–4.9)
Alkaline Phosphatase: 81 IU/L (ref 44–121)
BUN/Creatinine Ratio: 19 (ref 12–28)
BUN: 17 mg/dL (ref 8–27)
Bilirubin Total: 0.5 mg/dL (ref 0.0–1.2)
CO2: 24 mmol/L (ref 20–29)
Calcium: 9.4 mg/dL (ref 8.7–10.3)
Chloride: 100 mmol/L (ref 96–106)
Creatinine, Ser: 0.9 mg/dL (ref 0.57–1.00)
Globulin, Total: 2.5 g/dL (ref 1.5–4.5)
Glucose: 67 mg/dL — ABNORMAL LOW (ref 70–99)
Potassium: 4.6 mmol/L (ref 3.5–5.2)
Sodium: 139 mmol/L (ref 134–144)
Total Protein: 6.8 g/dL (ref 6.0–8.5)
eGFR: 71 mL/min/{1.73_m2} (ref >59)

## 2024-01-19 LAB — VITAMIN D 25 HYDROXY (VIT D DEFICIENCY, FRACTURES): Vit D, 25-Hydroxy: 32.2 ng/mL (ref 30.0–100.0)

## 2024-01-19 LAB — LIPID PANEL
Chol/HDL Ratio: 3.1 ratio (ref 0.0–4.4)
Cholesterol, Total: 187 mg/dL (ref 100–199)
HDL: 60 mg/dL (ref >39)
LDL Chol Calc (NIH): 118 mg/dL — ABNORMAL HIGH (ref 0–99)
Triglycerides: 44 mg/dL (ref 0–149)
VLDL Cholesterol Cal: 9 mg/dL (ref 5–40)

## 2024-01-20 LAB — TOXASSURE SELECT 13 (MW), URINE

## 2024-01-20 LAB — H. PYLORI ANTIGEN, STOOL: H pylori Ag, Stl: NEGATIVE

## 2024-02-01 ENCOUNTER — Encounter: Payer: Self-pay | Admitting: Allergy & Immunology

## 2024-02-07 ENCOUNTER — Other Ambulatory Visit: Payer: Self-pay

## 2024-02-07 ENCOUNTER — Encounter: Payer: Self-pay | Admitting: Allergy & Immunology

## 2024-02-07 ENCOUNTER — Ambulatory Visit: Admitting: Allergy & Immunology

## 2024-02-07 VITALS — BP 124/84 | HR 75 | Temp 97.6°F | Resp 16

## 2024-02-07 DIAGNOSIS — L508 Other urticaria: Secondary | ICD-10-CM | POA: Diagnosis not present

## 2024-02-07 DIAGNOSIS — L501 Idiopathic urticaria: Secondary | ICD-10-CM | POA: Diagnosis not present

## 2024-02-07 NOTE — Progress Notes (Unsigned)
 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   Subjective:   Karen White is a 68 y.o. female presenting today for follow up of No chief complaint on file.   Karen White has a history of the following: Patient Active Problem List   Diagnosis Date Noted   Chronic urticaria 03/02/2023   Weight loss 03/02/2023   Pure hypercholesterolemia 12/31/2020   Generalized anxiety disorder with panic attacks 02/21/2020   Migraine 09/11/2018   Acute bronchitis 09/21/2016   Menopause 09/21/2016   Lipoma of back 01/06/2012   Precordial pain 12/14/2011   Elevated blood pressure 12/14/2011   Cardiac murmur 12/14/2011   Cervicalgia 08/21/2011   ESOPHAGEAL STRICTURE 08/12/2010   ESOPHAGEAL REFLUX 07/28/2010   DYSPHAGIA UNSPECIFIED 07/28/2010   DIARRHEA 07/28/2010    History obtained from: chart review and {Persons; PED relatives w/patient:19415::"patient"}.  Discussed the use of AI scribe software for clinical note transcription with the patient and/or guardian, who gave verbal consent to proceed.  Karen White is a 68 y.o. female presenting for {Blank single:19197::"a food challenge","a drug challenge","skin testing","a sick visit","an evaluation of ***","a follow up visit"}. She was last seen in January 2025. At that time, she was doing very well on Xolair . We decided to increase her to every 14 days.   Asthma/Respiratory Symptom History: ***  Allergic Rhinitis Symptom History: ***  Food Allergy  Symptom History: ***  Skin Symptom History: ***  GERD Symptom  History: ***  Infection Symptom History: ***  Otherwise, there have been no changes to her past medical history, surgical history, family history, or social history.    Review of systems otherwise negative other than that mentioned in the HPI.    Objective:   There were no vitals taken for this visit. There is no height or weight on file to calculate BMI.    Physical Exam   Diagnostic studies: {Blank single:19197::"none","deferred due to recent antihistamine use","deferred due to insurance stipulations that require a separate visit for testing","labs sent instead"," "}  Spirometry: {Blank single:19197::"results normal (FEV1: ***%, FVC: ***%, FEV1/FVC: ***%)","results abnormal (FEV1: ***%, FVC: ***%, FEV1/FVC: ***%)"}.    {Blank single:19197::"Spirometry consistent with mild obstructive disease","Spirometry consistent with moderate obstructive disease","Spirometry consistent with severe obstructive disease","Spirometry consistent with possible restrictive disease","Spirometry consistent with mixed obstructive and restrictive disease","Spirometry uninterpretable due to technique","Spirometry consistent with normal pattern"}. {Blank single:19197::"Albuterol /Atrovent  nebulizer","Xopenex/Atrovent  nebulizer","Albuterol  nebulizer","Albuterol  four puffs via MDI","Xopenex four puffs via MDI"} treatment given in clinic with {Blank single:19197::"significant improvement in FEV1 per ATS criteria","significant improvement in FVC per ATS criteria","significant improvement in FEV1 and FVC per ATS criteria","improvement in FEV1, but not significant per ATS criteria","improvement in FVC, but not significant per ATS criteria","improvement in FEV1 and FVC, but not significant per ATS criteria","no improvement"}.  Allergy  Studies: {Blank single:19197::"none","deferred due to recent antihistamine use","deferred due to insurance stipulations that require a separate visit for testing","labs sent instead","  "}    {Blank single:19197::"Allergy  testing results were read and interpreted by myself, documented by clinical staff."," "}      Drexel Gentles, MD  [facility]  [place]

## 2024-02-07 NOTE — Patient Instructions (Addendum)
 1. 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   - Dupixent was recently approved for treatment of hives, so that might be a good option.  - Information on Dupixent provided.  - Let's start Allegra daily to see if this helps and take it EVERY DAY to see if this helps. - Allegra enters the brain the LEAST amount of all of the antihistamines, so maybe the weight gain will be less.   2. Return in about 3 months (around 05/09/2024). You can have the follow up appointment with Dr. Idolina Maker or a Nurse Practicioner (our Nurse Practitioners are excellent and always have Physician oversight!).    Please inform us  of any Emergency Department visits, hospitalizations, or changes in symptoms. Call us  before going to the ED for breathing or allergy  symptoms since we might be able to fit you in for a sick visit. Feel free to contact us  anytime with any questions, problems, or concerns.  It was a pleasure to see you again today!  Websites that have reliable patient information: 1. American Academy of Asthma, Allergy , and Immunology: www.aaaai.org 2. Food Allergy  Research and Education (FARE): foodallergy.org 3. Mothers of Asthmatics: http://www.asthmacommunitynetwork.org 4. American College of Allergy , Asthma, and Immunology: www.acaai.org      "Like" us  on Facebook and Instagram for our latest updates!      A healthy democracy works best when [employer] participate! Make sure you are registered to vote! If you have moved or changed any of your contact information, you will need to get this updated before voting! Scan the QR codes below to learn more!

## 2024-02-08 ENCOUNTER — Encounter: Payer: Self-pay | Admitting: Allergy & Immunology

## 2024-02-08 MED ORDER — FEXOFENADINE HCL 180 MG PO TABS: 180.0000 mg | ORAL_TABLET | Freq: Every day | ORAL | 1 refills | Status: AC

## 2024-02-17 ENCOUNTER — Ambulatory Visit
Admission: RE | Admit: 2024-02-17 | Discharge: 2024-02-17 | Disposition: A | Discharge status: Home / Self Care | Payer: Self-pay | Source: Ambulatory Visit | Attending: Nurse Practitioner | Admitting: Nurse Practitioner

## 2024-02-17 VITALS — BP 149/83 | HR 70 | Temp 98.0°F | Resp 18

## 2024-02-17 DIAGNOSIS — H6121 Impacted cerumen, right ear: Secondary | ICD-10-CM

## 2024-02-17 NOTE — ED Triage Notes (Signed)
 Pt reports right ear fullness, had her daughter look in the ear after having head pain on the right side and it looks like there is wax build up in the ear. Started yesterday.

## 2024-02-17 NOTE — Discharge Instructions (Signed)
 Recommend use over-the-counter Debrox earwax softener.  Apply 1 to 2 drops 2-3 times weekly as needed. Do not use Q-tips as this makes symptoms worse and pushes wax further into the ear canal. Follow-up in this clinic as needed for earwax removal or ear irrigation. Follow-up as needed.

## 2024-02-17 NOTE — ED Provider Notes (Addendum)
 [facility]    CSN: [id number] Arrival date & time: 02/17/24  1050      History   Chief Complaint Chief Complaint  Patient presents with   Ear Fullness    Entered by patient    HPI Karen White is a 68 y.o. female.   The history is provided by the patient.   Patient presents with a 1 day history of right ear fullness and pressure.  Patient states that she had pain on the right side of her head.  States that her daughter is her Careers adviser and looked inside of the ear and saw that the patient had wax buildup in the ear.  Patient states that her daughter tried to remove the earwax with out good success.  Patient states she was unable to tolerate the attempt.  Patient denies fever, chills, ear drainage, decreased hearing, nasal congestion, runny nose, or cough. Past Medical History:  Diagnosis Date   Allergy     Anxiety    Depression    GERD (gastroesophageal reflux disease)    Migraine    Urticaria     Patient Active Problem List   Diagnosis Date Noted   Chronic urticaria 03/02/2023   Weight loss 03/02/2023   Pure hypercholesterolemia 12/31/2020   Generalized anxiety disorder with panic attacks 02/21/2020   Migraine 09/11/2018   Acute bronchitis 09/21/2016   Menopause 09/21/2016   Lipoma of back 01/06/2012   Precordial pain 12/14/2011   Elevated blood pressure 12/14/2011   Cardiac murmur 12/14/2011   Cervicalgia 08/21/2011   ESOPHAGEAL STRICTURE 08/12/2010   ESOPHAGEAL REFLUX 07/28/2010   DYSPHAGIA UNSPECIFIED 07/28/2010   DIARRHEA 07/28/2010    Past Surgical History:  Procedure Laterality Date   mercury posion from fillings     Schwanoma tumor resection     TONGUE BIOPSY      OB History   No obstetric history on file.      Home Medications    Prior to Admission medications   Medication Sig Start Date End Date Taking? Authorizing Provider  ALPRAZolam  (XANAX ) 0.5 MG tablet Take 0.5-1 tablets (0.25-0.5 mg total) by mouth at bedtime as needed  for anxiety. 01/18/24   Eliodoro Guerin, DO  cetirizine  (ZYRTEC ) 10 MG tablet Take 1 tablet (10 mg total) by mouth 2 (two) times daily. 04/23/23   Rochester Chuck, MD  EPINEPHrine  (EPIPEN  2-PAK) 0.3 mg/0.3 mL IJ SOAJ injection Inject 0.3 mg into the muscle as needed for anaphylaxis. 05/27/22   Eliodoro Guerin, DO  escitalopram  (LEXAPRO ) 10 MG tablet TAKE ONE (1) TABLET EACH DAY. DOSE CHANGE 07/30/23   Vicky Grange M, DO  famotidine  (PEPCID ) 20 MG tablet Take 1 tablet (20 mg total) by mouth 2 (two) times daily. 01/18/24   Eliodoro Guerin, DO  fexofenadine  (ALLEGRA ) 180 MG tablet Take 1 tablet (180 mg total) by mouth daily. 02/08/24   Rochester Chuck, MD  omalizumab  (XOLAIR ) 300 MG/2  ML prefilled syringe Inject 300 mg into the skin every 14 (fourteen) days. 10/27/23   [place]ster Chuck, MD  rizatriptan  (MAXALT -MLT) 10 MG disintegrating tablet PLACE 1 TABLET ON TONGUE AND ALLOW TO DISSOLVE AS NEEDED FOR ONSET OFHEADACHE 01/18/24   Eliodoro Guerin, DO    Family History Family History  Problem Relation Age of Onset   Coronary artery disease Mother        Age 26s   Cancer Mother    Coronary artery disease Father        Age  80s   Heart disease Father    Cancer Maternal Grandmother        ovarian   Colon cancer Neg Hx    Colon polyps Neg Hx    Esophageal cancer Neg Hx    Rectal cancer Neg Hx    Stomach cancer Neg Hx    Allergic rhinitis Neg Hx    Angioedema Neg Hx    Asthma Neg Hx    Eczema Neg Hx    Urticaria Neg Hx     Social History Social History   Tobacco Use   Smoking status: Former    Current packs/day: 0.00    Types: Cigarettes    Quit date: 01/06/1976    Years since quitting: 48.1   Smokeless tobacco: Never   Tobacco comments:    Not smoking now  Substance Use Topics   Alcohol use: Yes    Alcohol/week: 2.0 standard drinks of alcohol    Types: 2 Glasses of wine per week    Comment: One drink a week   Drug use: No     Allergies   Molds  & smuts   Review of Systems Review of Systems Per HPI  Physical Exam Triage Vital Signs ED Triage Vitals  Encounter Vitals Group     BP 02/17/24 1111 (!) 149/83     Systolic BP Percentile --      Diastolic BP Percentile --      Pulse Rate 02/17/24 1111 70     Resp 02/17/24 1111 18     Temp 02/17/24 1111 98 F (36.7 C)     Temp Source 02/17/24 1111 Oral     SpO2 02/17/24 1111 97 %     Weight --      Height --      Head Circumference --      Peak Flow --      Pain Score 02/17/24 1114 6     Pain Loc --      Pain Education --      Exclude from Growth Chart --    No data found.  Updated Vital Signs BP (!) 149/83 (BP Location: Right Arm)   Pulse 70   Temp 98 F (36.7 C) (Oral)   Resp 18   SpO2 97%   Visual Acuity Right Eye Distance:   Left Eye Distance:   Bilateral Distance:    Right Eye Near:   Left Eye Near:    Bilateral Near:     Physical Exam Vitals and nursing note reviewed.  Constitutional:      General: She is not in acute distress.    Appearance: Normal appearance.  HENT:     Head: Normocephalic.     Right Ear: External ear normal. There is impacted cerumen.     Left Ear: Tympanic membrane, ear canal and external ear normal.     Ears:     Comments: Impaction of the right ear.  Ear irrigation was performed, post ear irrigation, complete removal of cerumen impaction.  Tympanic membrane without erythema or bulging.  Patient tolerated the procedure well.    Nose: Nose normal.     Mouth/Throat:     Mouth: Mucous membranes are moist.  Eyes:     Extraocular Movements: Extraocular movements intact.     Pupils: Pupils are equal, round, and reactive to light.  Pulmonary:     Effort: Pulmonary effort is normal.  Musculoskeletal:     Cervical back: Normal range of motion.  Skin:  General: Skin is warm and dry.  Neurological:     General: No focal deficit present.     Mental Status: She is alert and oriented to person, place, and time.  Psychiatric:         Mood and Affect: Mood normal.        Behavior: Behavior normal.      UC Treatments / Results  Labs (all labs ordered are listed, but only abnormal results are displayed) Labs Reviewed - No data to display  EKG   Radiology No results found.  Procedures Procedures (including critical care time)  Medications Ordered in UC Medications - No data to display  Initial Impression / Assessment and Plan / UC Course  I have reviewed the triage vital signs and the nursing notes.  Pertinent labs & imaging results that were available during my care of the patient were reviewed by me and considered in my medical decision making (see chart for details).  Ear irrigation of the right ear performed with good success.  Patient tolerated the procedure well.  Recommended over-the-counter Debrox to prevent further cerumen impaction.  Supportive care recommendations were provided and discussed with the patient to include over-the-counter analgesics, warm compresses.  Patient was in agreement with this plan of care and verbalized understanding.  All questions were answered.  Patient stable for discharge.  Final Clinical Impressions(s) / UC Diagnoses   Final diagnoses:  None   Discharge Instructions   None    ED Prescriptions   None    PDMP not reviewed this encounter.   Hardy Lia, NP 02/17/24 1352    Hardy Lia, NP 02/17/24 1353

## 2024-04-15 ENCOUNTER — Other Ambulatory Visit: Payer: Self-pay | Admitting: Family Medicine

## 2024-04-15 DIAGNOSIS — F410 Panic disorder [episodic paroxysmal anxiety] without agoraphobia: Secondary | ICD-10-CM

## 2024-04-28 ENCOUNTER — Ambulatory Visit: Payer: Medicare PPO | Admitting: Allergy & Immunology

## 2024-05-05 ENCOUNTER — Encounter: Payer: Self-pay | Admitting: *Deleted

## 2024-05-05 ENCOUNTER — Encounter: Payer: Self-pay | Admitting: Internal Medicine

## 2024-05-05 ENCOUNTER — Ambulatory Visit: Admitting: Internal Medicine

## 2024-05-05 ENCOUNTER — Telehealth: Payer: Self-pay | Admitting: *Deleted

## 2024-05-05 VITALS — BP 153/82 | HR 71 | Temp 98.2°F | Ht 61.0 in | Wt 144.5 lb

## 2024-05-05 DIAGNOSIS — R140 Abdominal distension (gaseous): Secondary | ICD-10-CM

## 2024-05-05 DIAGNOSIS — K219 Gastro-esophageal reflux disease without esophagitis: Secondary | ICD-10-CM

## 2024-05-05 DIAGNOSIS — R490 Dysphonia: Secondary | ICD-10-CM

## 2024-05-05 MED ORDER — DEXLANSOPRAZOLE 60 MG PO CPDR
60.0000 mg | DELAYED_RELEASE_CAPSULE | Freq: Every day | ORAL | 3 refills | Status: AC
Start: 2024-05-05 — End: 2025-05-05

## 2024-05-05 NOTE — Progress Notes (Signed)
 Primary Care Physician:  Jolinda Norene HERO, DO Primary Gastroenterologist:  Dr. Cindie  Chief Complaint  Patient presents with   Gastroesophageal Reflux    Pt arrives due to discuss worsening GERD. Pt states her GERD has worsened in the past 6 months. Pt states she has been constantly taking Tums, Pepcid  and OTC antacids. Pt states she gave up tea, does not eat spicy food.     HPI:   Karen White is a 68 y.o. female who presents to today to discuss chronic acid reflux.  History of chronic GERD for many years.  Previously on Dexilant  over 10 years ago.  Now taking famotidine  and having breakthrough symptoms.  Describes a burning in her throat, worse at night.  Taking a lot of Tums on top of her Pepcid .  Denies any overt dysphagia or odynophagia.  Some epigastric discomfort.  Reports associated abdominal bloating.  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  Currently on Xolair .  History of angioedema as well.  EGD 07/29/2010 with reflux esophagitis, distal esophageal stricture dilated to 18 mm, otherwise normal exam.  Pathology showed evidence of reflux esophagitis at the GE junction.  No evidence of Barrett's esophagus.  Denies any melena or hematochezia.  No chronic NSAID use.  Last colonoscopy 2017 unremarkable with 10-year recall.  No family history of colorectal malignancy.  Past Medical History:  Diagnosis Date   Allergy     Anxiety    Depression    GERD (gastroesophageal reflux disease)    Migraine    Urticaria     Past Surgical History:  Procedure Laterality Date   mercury posion from fillings     Schwanoma tumor resection     TONGUE BIOPSY      Current Outpatient Medications  Medication Sig Dispense Refill   ALPRAZolam  (XANAX ) 0.5 MG tablet Take 0.5-1 tablets (0.25-0.5 mg total) by mouth at bedtime as needed for anxiety. 90 tablet 1   cetirizine  (ZYRTEC ) 10 MG tablet Take 1 tablet (10 mg total) by mouth 2 (two) times  daily. 180 tablet 1   EPINEPHrine  (EPIPEN  2-PAK) 0.3 mg/0.3 mL IJ SOAJ injection Inject 0.3 mg into the muscle as needed for anaphylaxis. 1 each 0   escitalopram  (LEXAPRO ) 10 MG tablet TAKE ONE (1) TABLET BY MOUTH EVERY DAY 90 tablet 1   famotidine  (PEPCID ) 20 MG tablet Take 1 tablet (20 mg total) by mouth 2 (two) times daily. 180 tablet 3   fexofenadine  (ALLEGRA ) 180 MG tablet Take 1 tablet (180 mg total) by mouth daily. 90 tablet 1   omalizumab  (XOLAIR ) 300 MG/2  ML prefilled syringe Inject 300 mg into the skin every 14 (fourteen) days. 12 mL 3   rizatriptan  (MAXALT -MLT) 10 MG disintegrating tablet PLACE 1 TABLET ON TONGUE AND ALLOW TO DISSOLVE AS NEEDED FOR ONSET OFHEADACHE 12 tablet 5   Current Facility-Administered Medications  Medication Dose Route Frequency Provider Last Rate Last Admin   omalizumab  (XOLAIR ) prefilled syringe 300 mg  300 mg Subcutaneous Q28 days Iva Marty Saltness, MD   300 mg at 05/26/23 9089    Allergies as of 05/05/2024 - Review Complete 05/05/2024  Allergen Reaction Noted   Molds & smuts Hives and Itching 04/06/2022    Family History  Problem Relation Age of Onset   Coronary artery disease Mother        Age 26s   Cancer Mother    Coronary artery disease Father        Age 3s  Heart disease Father    Cancer Maternal Grandmother        ovarian   Colon cancer Neg Hx    Colon polyps Neg Hx    Esophageal cancer Neg Hx    Rectal cancer Neg Hx    Stomach cancer Neg Hx    Allergic rhinitis Neg Hx    Angioedema Neg Hx    Asthma Neg Hx    Eczema Neg Hx    Urticaria Neg Hx     Social History   Socioeconomic History   Marital status: Single    Spouse name: Not on file   Number of children: Not on file   Years of education: Not on file   Highest education level: Bachelor's degree (e.g., BA, AB, BS)  Occupational History   Not on file  Tobacco Use   Smoking status: Former    Current packs/day: 0.00    Types: Cigarettes    Quit date: 01/06/1976     Years since quitting: 48.3   Smokeless tobacco: Never   Tobacco comments:    Not smoking now  Substance and Sexual Activity   Alcohol use: Yes    Alcohol/week: 2.0 standard drinks of alcohol    Types: 2 Glasses of wine per week    Comment: One drink a week   Drug use: No   Sexual activity: Not Currently    Birth control/protection: Post-menopausal  Other Topics Concern   Not on file  Social History Narrative   Not on file   Social Drivers of Health   Financial Resource Strain: Low Risk  (01/14/2024)   Overall Financial Resource Strain (CARDIA)    Difficulty of Paying Living Expenses: Not hard at all  Food Insecurity: No Food Insecurity (01/14/2024)   Hunger Vital Sign    Worried About Running Out of Food in the Last Year: Never true    Ran Out of Food in the Last Year: Never true  Transportation Needs: No Transportation Needs (01/14/2024)   PRAPARE - Administrator, Civil Service (Medical): No    Lack of Transportation (Non-Medical): No  Physical Activity: Sufficiently Active (01/14/2024)   Exercise Vital Sign    Days of Exercise per Week: 4 days    Minutes of Exercise per Session: 60 min  Stress: No Stress Concern Present (01/14/2024)   [employer] of Occupational Health - Occupational Stress Questionnaire    Feeling of Stress : Only a little  Social Connections: Moderately Integrated (01/14/2024)   Social Connection and Isolation Panel    Frequency of Communication with Friends and Family: More than three times a week    Frequency of Social Gatherings with Friends and Family: Three times a week    Attends Religious Services: More than 4 times per year    Active Member of Clubs or Organizations: Yes    Attends Banker Meetings: More than 4 times per year    Marital Status: Divorced  Intimate Partner Violence: Not At Risk (10/27/2023)   Humiliation, Afraid, Rape, and Kick questionnaire    Fear of Current or Ex-Partner: No    Emotionally Abused:  No    Physically Abused: No    Sexually Abused: No    Subjective: Review of Systems  Constitutional:  Negative for chills and fever.  HENT:  Negative for congestion and hearing loss.   Eyes:  Negative for blurred vision and double vision.  Respiratory:  Negative for cough and shortness of breath.   Cardiovascular:  Negative for chest pain and palpitations.  Gastrointestinal:  Positive for heartburn. Negative for abdominal pain, blood in stool, constipation, diarrhea, melena and vomiting.  Genitourinary:  Negative for dysuria and urgency.  Musculoskeletal:  Negative for joint pain and myalgias.  Skin:  Negative for itching and rash.  Neurological:  Negative for dizziness and headaches.  Psychiatric/Behavioral:  Negative for depression. The patient is not nervous/anxious.        Objective: BP (!) 154/85   Pulse 71   Temp 98.2 F (36.8 C)   Ht 5' 1 (1.549 m)   Wt 144 lb 8 oz (65.5 kg)   BMI 27.30 kg/m  Physical Exam Constitutional:      Appearance: Normal appearance.  HENT:     Head: Normocephalic and atraumatic.  Eyes:     Extraocular Movements: Extraocular movements intact.     Conjunctiva/sclera: Conjunctivae normal.  Cardiovascular:     Rate and Rhythm: Normal rate and regular rhythm.  Pulmonary:     Effort: Pulmonary effort is normal.     Breath sounds: Normal breath sounds.  Abdominal:     General: Bowel sounds are normal.     Palpations: Abdomen is soft.  Musculoskeletal:        General: No swelling. Normal range of motion.     Cervical back: Normal range of motion and neck supple.  Skin:    General: Skin is warm and dry.     Coloration: Skin is not jaundiced.  Neurological:     General: No focal deficit present.     Mental Status: She is alert and oriented to person, place, and time.  Psychiatric:        Mood and Affect: Mood normal.        Behavior: Behavior normal.      Assessment: *Chronic GERD-not well-controlled *Hoarseness of  voice *Abdominal bloating  Plan: Discussed in depth with patient today. Will schedule for EGD with possible dilation to evaluate for peptic ulcer disease, esophagitis, gastritis, H. Pylori, duodenitis, or other. Will also evaluate for esophageal stricture, Schatzki's ring, esophageal web or other.   The risks including infection, bleed, or perforation as well as benefits, limitations, alternatives and imponderables have been reviewed with the patient. Potential for esophageal dilation, biopsy, etc. have also been reviewed.  Questions have been answered. All parties agreeable.  Will screen for Barrett's esophagus as well as EOE.  Start dexlansoprazole  60 mg daily.  She can take Pepcid  on top of this.  Will check blood work today to screen for celiac disease.  Previous alpha gal panel negative.  Follow-up after EGD. 05/05/2024 9:00 AM   Disclaimer: This note was dictated with voice recognition software. Similar sounding words can inadvertently be transcribed and may not be corrected upon review.

## 2024-05-05 NOTE — Telephone Encounter (Signed)
 [employer] PA: Approved Authorization #[id number]  Tracking #[id number] Dates of service 05/29/2024 - 08/28/2024

## 2024-05-05 NOTE — Patient Instructions (Signed)
 We will schedule you for upper endoscopy to further evaluate your worsening acid reflux.  Will screen you for Barrett's esophagus as well as for eosinophilic esophagitis.  I am going to start you on Dexilant  60 mg daily.  Let me know if this is too expensive and I can send in a different medication.  You can take Pepcid  on top of this as needed for breakthrough symptoms.  I am going to check blood work today at [facility] to screen for celiac disease.  We will call with results.  It was very nice meeting you today.  Dr. Cindie

## 2024-05-05 NOTE — H&P (View-Only) (Signed)
 Primary Care Physician:  Jolinda Norene HERO, DO Primary Gastroenterologist:  Dr. Cindie  Chief Complaint  Patient presents with   Gastroesophageal Reflux    Pt arrives due to discuss worsening GERD. Pt states her GERD has worsened in the past 6 months. Pt states she has been constantly taking Tums, Pepcid  and OTC antacids. Pt states she gave up tea, does not eat spicy food.     HPI:   Karen White is a 68 y.o. female who presents to today to discuss chronic acid reflux.  History of chronic GERD for many years.  Previously on Dexilant  over 10 years ago.  Now taking famotidine  and having breakthrough symptoms.  Describes a burning in her throat, worse at night.  Taking a lot of Tums on top of her Pepcid .  Denies any overt dysphagia or odynophagia.  Some epigastric discomfort.  Reports associated abdominal bloating.  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  Currently on Xolair .  History of angioedema as well.  EGD 07/29/2010 with reflux esophagitis, distal esophageal stricture dilated to 18 mm, otherwise normal exam.  Pathology showed evidence of reflux esophagitis at the GE junction.  No evidence of Barrett's esophagus.  Denies any melena or hematochezia.  No chronic NSAID use.  Last colonoscopy 2017 unremarkable with 10-year recall.  No family history of colorectal malignancy.  Past Medical History:  Diagnosis Date   Allergy     Anxiety    Depression    GERD (gastroesophageal reflux disease)    Migraine    Urticaria     Past Surgical History:  Procedure Laterality Date   mercury posion from fillings     Schwanoma tumor resection     TONGUE BIOPSY      Current Outpatient Medications  Medication Sig Dispense Refill   ALPRAZolam  (XANAX ) 0.5 MG tablet Take 0.5-1 tablets (0.25-0.5 mg total) by mouth at bedtime as needed for anxiety. 90 tablet 1   cetirizine  (ZYRTEC ) 10 MG tablet Take 1 tablet (10 mg total) by mouth 2 (two) times  daily. 180 tablet 1   EPINEPHrine  (EPIPEN  2-PAK) 0.3 mg/0.3 mL IJ SOAJ injection Inject 0.3 mg into the muscle as needed for anaphylaxis. 1 each 0   escitalopram  (LEXAPRO ) 10 MG tablet TAKE ONE (1) TABLET BY MOUTH EVERY DAY 90 tablet 1   famotidine  (PEPCID ) 20 MG tablet Take 1 tablet (20 mg total) by mouth 2 (two) times daily. 180 tablet 3   fexofenadine  (ALLEGRA ) 180 MG tablet Take 1 tablet (180 mg total) by mouth daily. 90 tablet 1   omalizumab  (XOLAIR ) 300 MG/2  ML prefilled syringe Inject 300 mg into the skin every 14 (fourteen) days. 12 mL 3   rizatriptan  (MAXALT -MLT) 10 MG disintegrating tablet PLACE 1 TABLET ON TONGUE AND ALLOW TO DISSOLVE AS NEEDED FOR ONSET OFHEADACHE 12 tablet 5   Current Facility-Administered Medications  Medication Dose Route Frequency Provider Last Rate Last Admin   omalizumab  (XOLAIR ) prefilled syringe 300 mg  300 mg Subcutaneous Q28 days Iva Marty Saltness, MD   300 mg at 05/26/23 9089    Allergies as of 05/05/2024 - Review Complete 05/05/2024  Allergen Reaction Noted   Molds & smuts Hives and Itching 04/06/2022    Family History  Problem Relation Age of Onset   Coronary artery disease Mother        Age 26s   Cancer Mother    Coronary artery disease Father        Age 3s  Heart disease Father    Cancer Maternal Grandmother        ovarian   Colon cancer Neg Hx    Colon polyps Neg Hx    Esophageal cancer Neg Hx    Rectal cancer Neg Hx    Stomach cancer Neg Hx    Allergic rhinitis Neg Hx    Angioedema Neg Hx    Asthma Neg Hx    Eczema Neg Hx    Urticaria Neg Hx     Social History   Socioeconomic History   Marital status: Single    Spouse name: Not on file   Number of children: Not on file   Years of education: Not on file   Highest education level: Bachelor's degree (e.g., BA, AB, BS)  Occupational History   Not on file  Tobacco Use   Smoking status: Former    Current packs/day: 0.00    Types: Cigarettes    Quit date: 01/06/1976     Years since quitting: 48.3   Smokeless tobacco: Never   Tobacco comments:    Not smoking now  Substance and Sexual Activity   Alcohol use: Yes    Alcohol/week: 2.0 standard drinks of alcohol    Types: 2 Glasses of wine per week    Comment: One drink a week   Drug use: No   Sexual activity: Not Currently    Birth control/protection: Post-menopausal  Other Topics Concern   Not on file  Social History Narrative   Not on file   Social Drivers of Health   Financial Resource Strain: Low Risk  (01/14/2024)   Overall Financial Resource Strain (CARDIA)    Difficulty of Paying Living Expenses: Not hard at all  Food Insecurity: No Food Insecurity (01/14/2024)   Hunger Vital Sign    Worried About Running Out of Food in the Last Year: Never true    Ran Out of Food in the Last Year: Never true  Transportation Needs: No Transportation Needs (01/14/2024)   PRAPARE - Administrator, Civil Service (Medical): No    Lack of Transportation (Non-Medical): No  Physical Activity: Sufficiently Active (01/14/2024)   Exercise Vital Sign    Days of Exercise per Week: 4 days    Minutes of Exercise per Session: 60 min  Stress: No Stress Concern Present (01/14/2024)   [employer] of Occupational Health - Occupational Stress Questionnaire    Feeling of Stress : Only a little  Social Connections: Moderately Integrated (01/14/2024)   Social Connection and Isolation Panel    Frequency of Communication with Friends and Family: More than three times a week    Frequency of Social Gatherings with Friends and Family: Three times a week    Attends Religious Services: More than 4 times per year    Active Member of Clubs or Organizations: Yes    Attends Banker Meetings: More than 4 times per year    Marital Status: Divorced  Intimate Partner Violence: Not At Risk (10/27/2023)   Humiliation, Afraid, Rape, and Kick questionnaire    Fear of Current or Ex-Partner: No    Emotionally Abused:  No    Physically Abused: No    Sexually Abused: No    Subjective: Review of Systems  Constitutional:  Negative for chills and fever.  HENT:  Negative for congestion and hearing loss.   Eyes:  Negative for blurred vision and double vision.  Respiratory:  Negative for cough and shortness of breath.   Cardiovascular:  Negative for chest pain and palpitations.  Gastrointestinal:  Positive for heartburn. Negative for abdominal pain, blood in stool, constipation, diarrhea, melena and vomiting.  Genitourinary:  Negative for dysuria and urgency.  Musculoskeletal:  Negative for joint pain and myalgias.  Skin:  Negative for itching and rash.  Neurological:  Negative for dizziness and headaches.  Psychiatric/Behavioral:  Negative for depression. The patient is not nervous/anxious.        Objective: BP (!) 154/85   Pulse 71   Temp 98.2 F (36.8 C)   Ht 5' 1 (1.549 m)   Wt 144 lb 8 oz (65.5 kg)   BMI 27.30 kg/m  Physical Exam Constitutional:      Appearance: Normal appearance.  HENT:     Head: Normocephalic and atraumatic.  Eyes:     Extraocular Movements: Extraocular movements intact.     Conjunctiva/sclera: Conjunctivae normal.  Cardiovascular:     Rate and Rhythm: Normal rate and regular rhythm.  Pulmonary:     Effort: Pulmonary effort is normal.     Breath sounds: Normal breath sounds.  Abdominal:     General: Bowel sounds are normal.     Palpations: Abdomen is soft.  Musculoskeletal:        General: No swelling. Normal range of motion.     Cervical back: Normal range of motion and neck supple.  Skin:    General: Skin is warm and dry.     Coloration: Skin is not jaundiced.  Neurological:     General: No focal deficit present.     Mental Status: She is alert and oriented to person, place, and time.  Psychiatric:        Mood and Affect: Mood normal.        Behavior: Behavior normal.      Assessment: *Chronic GERD-not well-controlled *Hoarseness of  voice *Abdominal bloating  Plan: Discussed in depth with patient today. Will schedule for EGD with possible dilation to evaluate for peptic ulcer disease, esophagitis, gastritis, H. Pylori, duodenitis, or other. Will also evaluate for esophageal stricture, Schatzki's ring, esophageal web or other.   The risks including infection, bleed, or perforation as well as benefits, limitations, alternatives and imponderables have been reviewed with the patient. Potential for esophageal dilation, biopsy, etc. have also been reviewed.  Questions have been answered. All parties agreeable.  Will screen for Barrett's esophagus as well as EOE.  Start dexlansoprazole  60 mg daily.  She can take Pepcid  on top of this.  Will check blood work today to screen for celiac disease.  Previous alpha gal panel negative.  Follow-up after EGD. 05/05/2024 9:00 AM   Disclaimer: This note was dictated with voice recognition software. Similar sounding words can inadvertently be transcribed and may not be corrected upon review.

## 2024-05-08 LAB — CELIAC DISEASE PANEL
(tTG) Ab, IgA: <1 U/mL
(tTG) Ab, IgG: <1 U/mL
Gliadin IgA: <1 U/mL
Gliadin IgG: <1 U/mL
Immunoglobulin A: 61 mg/dL — ABNORMAL LOW (ref 70–320)

## 2024-05-19 ENCOUNTER — Ambulatory Visit: Admitting: Family Medicine

## 2024-05-25 ENCOUNTER — Encounter (HOSPITAL_COMMUNITY): Payer: Self-pay

## 2024-05-25 ENCOUNTER — Encounter (HOSPITAL_COMMUNITY)
Admission: RE | Admit: 2024-05-25 | Discharge: 2024-05-25 | Disposition: A | Discharge status: Home / Self Care | Source: Ambulatory Visit | Attending: Internal Medicine | Admitting: Internal Medicine

## 2024-05-25 ENCOUNTER — Other Ambulatory Visit: Payer: Self-pay

## 2024-05-29 ENCOUNTER — Ambulatory Visit (HOSPITAL_COMMUNITY)
Admission: RE | Admit: 2024-05-29 | Discharge: 2024-05-29 | Disposition: A | Discharge status: Home / Self Care | Attending: Internal Medicine | Admitting: Internal Medicine

## 2024-05-29 ENCOUNTER — Encounter (HOSPITAL_COMMUNITY): Payer: Self-pay | Admitting: Internal Medicine

## 2024-05-29 ENCOUNTER — Other Ambulatory Visit: Payer: Self-pay

## 2024-05-29 ENCOUNTER — Ambulatory Visit (HOSPITAL_BASED_OUTPATIENT_CLINIC_OR_DEPARTMENT_OTHER): Admitting: Certified Registered Nurse Anesthetist

## 2024-05-29 ENCOUNTER — Ambulatory Visit (HOSPITAL_COMMUNITY): Admitting: Certified Registered Nurse Anesthetist

## 2024-05-29 ENCOUNTER — Encounter (HOSPITAL_COMMUNITY)
Admission: RE | Disposition: A | Discharge status: Home / Self Care | Payer: Self-pay | Source: Home / Self Care | Attending: Internal Medicine

## 2024-05-29 DIAGNOSIS — K2100 Gastro-esophageal reflux disease with esophagitis, without bleeding: Secondary | ICD-10-CM | POA: Diagnosis not present

## 2024-05-29 DIAGNOSIS — Z87891 Personal history of nicotine dependence: Secondary | ICD-10-CM | POA: Diagnosis not present

## 2024-05-29 DIAGNOSIS — F419 Anxiety disorder, unspecified: Secondary | ICD-10-CM | POA: Diagnosis not present

## 2024-05-29 DIAGNOSIS — K2970 Gastritis, unspecified, without bleeding: Secondary | ICD-10-CM | POA: Diagnosis not present

## 2024-05-29 DIAGNOSIS — R140 Abdominal distension (gaseous): Secondary | ICD-10-CM | POA: Insufficient documentation

## 2024-05-29 DIAGNOSIS — K449 Diaphragmatic hernia without obstruction or gangrene: Secondary | ICD-10-CM | POA: Insufficient documentation

## 2024-05-29 DIAGNOSIS — K2289 Other specified disease of esophagus: Secondary | ICD-10-CM | POA: Diagnosis not present

## 2024-05-29 DIAGNOSIS — K219 Gastro-esophageal reflux disease without esophagitis: Secondary | ICD-10-CM | POA: Insufficient documentation

## 2024-05-29 DIAGNOSIS — K222 Esophageal obstruction: Secondary | ICD-10-CM | POA: Diagnosis not present

## 2024-05-29 HISTORY — PX: ESOPHAGOGASTRODUODENOSCOPY: SHX5428

## 2024-05-29 SURGERY — EGD (ESOPHAGOGASTRODUODENOSCOPY)
Anesthesia: General

## 2024-05-29 MED ORDER — LIDOCAINE 2% (20 MG/ML) 5 ML SYRINGE
INTRAMUSCULAR | Status: DC | PRN
  Administered 2024-05-29: 100 mg via INTRAVENOUS

## 2024-05-29 MED ORDER — DEXMEDETOMIDINE HCL IN NACL 80 MCG/20ML IV SOLN
INTRAVENOUS | Status: DC | PRN
  Administered 2024-05-29 (×2): 10 ug via INTRAVENOUS

## 2024-05-29 MED ORDER — PROPOFOL 500 MG/50ML IV EMUL
INTRAVENOUS | Status: DC | PRN
  Administered 2024-05-29: 150 ug/kg/min via INTRAVENOUS

## 2024-05-29 MED ORDER — LACTATED RINGERS IV SOLN: INTRAVENOUS | Status: DC | PRN

## 2024-05-29 MED ORDER — PROPOFOL 10 MG/ML IV BOLUS
INTRAVENOUS | Status: DC | PRN
  Administered 2024-05-29: 50 mg via INTRAVENOUS

## 2024-05-29 MED ORDER — EPHEDRINE SULFATE-NACL 50-0.9 MG/10ML-% IV SOSY
PREFILLED_SYRINGE | INTRAVENOUS | Status: DC | PRN
  Administered 2024-05-29: 10 mg via INTRAVENOUS

## 2024-05-29 NOTE — Interval H&P Note (Signed)
 History and Physical Interval Note:  05/29/2024 10:36 AM  Karen White  has presented today for surgery, with the diagnosis of GERD.  The various methods of treatment have been discussed with the patient and family. After consideration of risks, benefits and other options for treatment, the patient has consented to  Procedure(s) with comments: EGD (ESOPHAGOGASTRODUODENOSCOPY) (N/A) - 10:15 am, asa 2 DILATION, ESOPHAGUS (N/A) as a surgical intervention.  The patient's history has been reviewed, patient examined, no change in status, stable for surgery.  I have reviewed the patient's chart and labs.  Questions were answered to the patient's satisfaction.     Karen White

## 2024-05-29 NOTE — Op Note (Signed)
 [facility] Patient Name: Karen White Procedure Date: 05/29/2024 10:33 AM MRN: [id number] Date of Birth: [birth year] Attending MD: Carlin POUR. Cindie , [place], [id number] CSN: [id number] Age: 68 Admit Type: Outpatient Procedure:                Upper GI endoscopy Indications:              Abdominal bloating Providers:                Carlin POUR. Cindie, DO, [place], [place],                            Technician Referring MD:              Medicines:                See the Anesthesia note for documentation of the                            administered medications Complications:            No immediate complications. Estimated Blood Loss:     Estimated blood loss was minimal. Procedure:                Pre-Anesthesia Assessment:                           - The anesthesia plan was to use monitored                            anesthesia care (MAC).                           After obtaining informed consent, the endoscope was                            passed under direct vision. Throughout the                            procedure, the patient's blood pressure, pulse, and                            oxygen saturations were monitored continuously. The                            [id number] ([id number]) Upper was introduced through                            the mouth, and advanced to the second part of                            duodenum. The upper GI endoscopy was accomplished                            without difficulty. The patient tolerated the                            procedure well. Scope In: 10:51:57 AM Scope  Out: 10:56:53 AM Total Procedure Duration: 0 hours 4 minutes 56 seconds  Findings:      There were esophageal mucosal changes suspicious for short-segment       Barrett's esophagus present at the gastroesophageal junction. The       maximum longitudinal extent of these mucosal changes was 1 cm in length.       Mucosa was biopsied with a cold forceps for histology. One  specimen       bottle was sent to pathology.      Biopsies were taken with a cold forceps in the middle third of the       esophagus for histology.      Patchy mild inflammation characterized by erythema was found in the       gastric body and in the gastric antrum. Biopsies were taken with a cold       forceps for Helicobacter pylori testing.      The duodenal bulb, first portion of the duodenum and second portion of       the duodenum were normal. Biopsies for histology were taken with a cold       forceps for evaluation of celiac disease.      A small hiatal hernia was present. Impression:               - Esophageal mucosal changes suspicious for                            short-segment Barrett's esophagus. Biopsied.                           - Gastritis. Biopsied.                           - Normal duodenal bulb, first portion of the                            duodenum and second portion of the duodenum.                            Biopsied.                           - Small hiatal hernia.                           - Biopsies were taken with a cold forceps for                            histology in the middle third of the esophagus. Moderate Sedation:      Per Anesthesia Care Recommendation:           - Patient has a contact number available for                            emergencies. The signs and symptoms of potential                            delayed complications were discussed with the  patient. Return to normal activities tomorrow.                            Written discharge instructions were provided to the                            patient.                           - Resume previous diet.                           - Continue present medications.                           - Await pathology results.                           - Repeat upper endoscopy in 5 years for                            surveillance based on pathology results.                            - Return to GI office in 3 months.                           - Use Dexilant  (dexlansoprazole ) 60 mg PO daily. Procedure Code(s):        --- Professional ---                           [phone], Esophagogastroduodenoscopy, flexible,                            transoral; with biopsy, single or multiple Diagnosis Code(s):        --- Professional ---                           K22.89, Other specified disease of esophagus                           K29.70, Gastritis, unspecified, without bleeding                           R14.0, Abdominal distension (gaseous) CPT copyright 2022 American Medical Association. All rights reserved. The codes documented in this report are preliminary and upon coder review may  be revised to meet current compliance requirements. Carlin POUR. Cindie, DO Carlin POUR. Maribel Hadley, DO 05/29/2024 11:00:10 AM This report has been signed electronically. Number of Addenda: 0

## 2024-05-29 NOTE — Discharge Instructions (Signed)
 EGD Discharge instructions Please read the instructions outlined below and refer to this sheet in the next few weeks. These discharge instructions provide you with general information on caring for yourself after you leave the hospital. Your doctor may also give you specific instructions. While your treatment has been planned according to the most current medical practices available, unavoidable complications occasionally occur. If you have any problems or questions after discharge, please call your doctor. ACTIVITY You may resume your regular activity but move at a slower pace for the next 24 hours.  Take frequent rest periods for the next 24 hours.  Walking will help expel (get rid of) the air and reduce the bloated feeling in your abdomen.  No driving for 24 hours (because of the anesthesia (medicine) used during the test).  You may shower.  Do not sign any important legal documents or operate any machinery for 24 hours (because of the anesthesia used during the test).  NUTRITION Drink plenty of fluids.  You may resume your normal diet.  Begin with a light meal and progress to your normal diet.  Avoid alcoholic beverages for 24 hours or as instructed by your caregiver.  MEDICATIONS You may resume your normal medications unless your caregiver tells you otherwise.  WHAT YOU CAN EXPECT TODAY You may experience abdominal discomfort such as a feeling of fullness or "gas" pains.  FOLLOW-UP Your doctor will discuss the results of your test with you.  SEEK IMMEDIATE MEDICAL ATTENTION IF ANY OF THE FOLLOWING OCCUR: Excessive nausea (feeling sick to your stomach) and/or vomiting.  Severe abdominal pain and distention (swelling).  Trouble swallowing.  Temperature over 101 F (37.8 C).  Rectal bleeding or vomiting of blood.   Your EGD revealed mild amount of inflammation in your stomach.  I took biopsies of this to rule out infection with a bacteria called H. pylori.  Small bowel appeared normal.   I did take samples to rule out celiac disease given your abdominal bloating.  You may also have short segment Barrett's esophagus.  I took samples today.  If positive, would recommend repeat EGD in 5 years.  Given all your underlying chronic allergy  issues I also took samples of the middle portion of your esophagus to rule out a condition called eosinophilic esophagitis.  Small hiatal hernia noted.  No tightenings or strictures of your esophagus were seen today.  I did not need to dilate.  Await pathology results, my office will contact you.  Continue on Dexilant  daily.  Follow-up with me in 2 to 3 months.  I hope you have a great rest of your week!  Karen White. Karen White, D.O. Gastroenterology and Hepatology [facility]

## 2024-05-29 NOTE — Transfer of Care (Signed)
 Immediate Anesthesia Transfer of Care Note  Patient: Karen White  Procedure(s) Performed: EGD (ESOPHAGOGASTRODUODENOSCOPY)  Patient Location: Short Stay  Anesthesia Type:MAC  Level of Consciousness: drowsy  Airway & Oxygen Therapy: Patient Spontanous Breathing  Post-op Assessment: Report given to RN and Post -op Vital signs reviewed and stable  Post vital signs: Reviewed and stable  Last Vitals:  Vitals Value Taken Time  BP 103/52  05/29/24 11:08  Temp    Pulse 73 05/29/24 11:08  Resp 15 05/29/24 11:08  SpO2 100% on RA 05/29/24 11:08    Last Pain:  Vitals:   05/29/24 1047  TempSrc:   PainSc: 0-No pain         Complications: No notable events documented.

## 2024-05-29 NOTE — Anesthesia Preprocedure Evaluation (Signed)
 Anesthesia Evaluation  Patient identified by MRN, date of birth, ID band Patient awake    Reviewed: Allergy  & Precautions, H&P , NPO status , Patient's Chart, lab work & pertinent test results, reviewed documented beta blocker date and time   Airway Mallampati: II  TM Distance: >3 FB Neck ROM: full    Dental no notable dental hx.    Pulmonary neg pulmonary ROS, former smoker   Pulmonary exam normal breath sounds clear to auscultation       Cardiovascular Exercise Tolerance: Good hypertension, + Valvular Problems/Murmurs  Rhythm:regular Rate:Normal     Neuro/Psych  Headaches PSYCHIATRIC DISORDERS Anxiety Depression       GI/Hepatic Neg liver ROS,GERD  ,,  Endo/Other  negative endocrine ROS    Renal/GU negative Renal ROS  negative genitourinary   Musculoskeletal   Abdominal   Peds  Hematology negative hematology ROS (+)   Anesthesia Other Findings - Left ventricle: The cavity size was normal. There was mild    focal basal hypertrophy of the septum. Systolic function    was normal. The estimated ejection fraction was in the    range of 60% to 65%. Wall motion was normal; there were no    regional wall motion abnormalities. Doppler parameters are    consistent with abnormal left ventricular relaxation    (grade 1 diastolic dysfunction).  - Mitral valve: Trivial regurgitation.  - Left atrium: The atrium was at the upper limits of normal    in size.  - Atrial septum: The interatrial septum was hypermobile. No    definite PFO or ASD.  - Tricuspid valve: Trivial regurgitation.  - Pericardium, extracardiac: There was no pericardial    effusion.    Reproductive/Obstetrics negative OB ROS                              Anesthesia Physical Anesthesia Plan  ASA: 2  Anesthesia Plan: General   Post-op Pain Management:    Induction:   PONV Risk Score and Plan: Propofol  infusion  Airway  Management Planned:   Additional Equipment:   Intra-op Plan:   Post-operative Plan:   Informed Consent: I have reviewed the patients History and Physical, chart, labs and discussed the procedure including the risks, benefits and alternatives for the proposed anesthesia with the patient or authorized representative who has indicated his/her understanding and acceptance.     Dental Advisory Given  Plan Discussed with: CRNA  Anesthesia Plan Comments:         Anesthesia Quick Evaluation

## 2024-05-30 LAB — SURGICAL PATHOLOGY

## 2024-06-01 ENCOUNTER — Encounter (HOSPITAL_COMMUNITY): Payer: Self-pay | Admitting: Internal Medicine

## 2024-06-03 NOTE — Anesthesia Postprocedure Evaluation (Signed)
 Anesthesia Post Note  Patient: Steven MARLA Pander  Procedure(s) Performed: EGD (ESOPHAGOGASTRODUODENOSCOPY)  Patient location during evaluation: Phase II Anesthesia Type: General Level of consciousness: awake Pain management: pain level controlled Vital Signs Assessment: post-procedure vital signs reviewed and stable Respiratory status: spontaneous breathing and respiratory function stable Cardiovascular status: blood pressure returned to baseline and stable Postop Assessment: no headache and no apparent nausea or vomiting Anesthetic complications: no Comments: Late entry   No notable events documented.   Last Vitals:  Vitals:   05/29/24 0934 05/29/24 1107  BP: 134/73 (!) 103/52  Pulse: 78 73  Resp: 20 18  Temp: 36.6 C 36.4 C  SpO2: 100% 98%    Last Pain:  Vitals:   05/29/24 1107  TempSrc: Axillary  PainSc: 0-No pain                 Yvonna JINNY Bosworth

## 2024-06-06 ENCOUNTER — Ambulatory Visit: Payer: Self-pay | Admitting: Internal Medicine

## 2024-06-20 ENCOUNTER — Ambulatory Visit: Admitting: Allergy & Immunology

## 2024-06-21 DIAGNOSIS — K219 Gastro-esophageal reflux disease without esophagitis: Secondary | ICD-10-CM | POA: Diagnosis not present

## 2024-06-21 DIAGNOSIS — Z13820 Encounter for screening for osteoporosis: Secondary | ICD-10-CM | POA: Diagnosis not present

## 2024-06-21 DIAGNOSIS — Z01419 Encounter for gynecological examination (general) (routine) without abnormal findings: Secondary | ICD-10-CM | POA: Diagnosis not present

## 2024-06-21 DIAGNOSIS — Z6828 Body mass index (BMI) 28.0-28.9, adult: Secondary | ICD-10-CM | POA: Diagnosis not present

## 2024-06-21 DIAGNOSIS — N958 Other specified menopausal and perimenopausal disorders: Secondary | ICD-10-CM | POA: Diagnosis not present

## 2024-06-21 DIAGNOSIS — Z1231 Encounter for screening mammogram for malignant neoplasm of breast: Secondary | ICD-10-CM | POA: Diagnosis not present

## 2024-06-21 LAB — HM DEXA SCAN

## 2024-07-06 ENCOUNTER — Encounter: Payer: Self-pay | Admitting: Internal Medicine

## 2024-07-11 ENCOUNTER — Other Ambulatory Visit: Payer: Self-pay

## 2024-07-11 ENCOUNTER — Ambulatory Visit: Admitting: Allergy & Immunology

## 2024-07-11 ENCOUNTER — Encounter: Payer: Self-pay | Admitting: Allergy & Immunology

## 2024-07-11 VITALS — BP 122/72 | HR 82 | Temp 98.4°F | Resp 16 | Ht 60.75 in | Wt 144.9 lb

## 2024-07-11 DIAGNOSIS — T783XXD Angioneurotic edema, subsequent encounter: Secondary | ICD-10-CM

## 2024-07-11 DIAGNOSIS — L501 Idiopathic urticaria: Secondary | ICD-10-CM

## 2024-07-11 MED ORDER — EPINEPHRINE 0.3 MG/0.3ML IJ SOAJ
0.3000 mg | INTRAMUSCULAR | 0 refills | Status: AC | PRN
Start: 2024-07-11 — End: ?

## 2024-07-11 NOTE — Patient Instructions (Addendum)
 1. 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  - We are not going to make any changes at this point in time.  - Continue to keep foods in your diet.  - Continue with Allegra  daily as you are doing. - Wean Allegra  as tolerated. - You can always restart it when you have a reaction.  2. Return in about 6 months (around 01/09/2025). You can have the follow up appointment with Dr. Iva or a Nurse Practicioner (our Nurse Practitioners are excellent and always have Physician oversight!).    Please inform us  of any Emergency Department visits, hospitalizations, or changes in symptoms. Call us  before going to the ED for breathing or allergy  symptoms since we might be able to fit you in for a sick visit. Feel free to contact us  anytime with any questions, problems, or concerns.  It was a pleasure to see you again today!  Websites that have reliable patient information: 1. American Academy of Asthma, Allergy , and Immunology: www.aaaai.org 2. Food Allergy  Research and Education (FARE): foodallergy.org 3. Mothers of Asthmatics: http://www.asthmacommunitynetwork.org 4. American College of Allergy , Asthma, and Immunology: www.acaai.org      "Like" us  on Facebook and Instagram for our latest updates!      A healthy democracy works best when [employer] participate! Make sure you are registered to vote! If you have moved or changed any of your contact information, you will need to get this updated before voting! Scan the QR codes below to learn more!

## 2024-07-11 NOTE — Progress Notes (Signed)
 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  - We are not going to make any changes at this point in time.  - Continue to keep foods in your diet.  - Continue with Allegra  daily as you are doing. - Wean Allegra  as tolerated. - You can always restart it when you have a reaction.  2. Return in about 6 months (around 01/09/2025). You can have the follow up appointment with Dr. Iva or a Nurse Practicioner (our Nurse Practitioners are excellent and always have Physician oversight!).    Subjective:   Karen White is a 68 y.o. female presenting today for follow up of  Chief Complaint  Patient presents with   Follow-up    She needs medication fill - Xolair      Karen White has a history of the following: Patient Active Problem List   Diagnosis Date Noted   Chronic urticaria 03/02/2023   Weight loss 03/02/2023   Pure hypercholesterolemia 12/31/2020   Generalized anxiety disorder with panic attacks 02/21/2020   Migraine 09/11/2018   Acute bronchitis 09/21/2016   Menopause 09/21/2016   Lipoma of back 01/06/2012   Precordial pain 12/14/2011   Elevated blood pressure 12/14/2011   Cardiac murmur 12/14/2011   Cervicalgia 08/21/2011   ESOPHAGEAL STRICTURE 08/12/2010   ESOPHAGEAL REFLUX 07/28/2010   DYSPHAGIA UNSPECIFIED 07/28/2010   DIARRHEA 07/28/2010    History obtained from: chart review and patient.  Discussed the use of AI scribe software for clinical note transcription with the patient and/or guardian, who gave verbal consent to proceed.  Karen White is a 68 y.o. female  presenting for a follow up visit.  She was last seen in May 2025.  At that time, we talked about changing her from Xolair  to Dupixent.  We started Allegra  daily to see if that helps keep her hives at bay.    Since last visit, she has done well.  She has not experienced any hives for a significant period and is currently on Xolair . She is currently on Xolair , administered every two weeks, and has two more refills left. She experienced issues with the delivery of her medication, resulting in a delay of two weeks for her last dose. Despite this, she did not experience any hives during the delay. She has since resolved the delivery issues by arranging for no more Friday deliveries and ensuring no signature is required for receipt.  She is also taking Allegra , which she forgot to take for two days while visiting her daughter. During this time, she cleaned out flower beds and developed some symptoms, which resolved after resuming Allegra . She is considering weaning off Allegra  by taking it every other day and then as needed. No recent sinus infections, although she has experienced increased sneezing over the past two days, which she attributes to her granddaughter's teething. She has not had a cold recently.  She is actively managing her weight by walking two to three miles every morning and is mindful of her diet, primarily consuming chicken and avoiding foods like onions. She mentions her family, including her daughter Karen White and her grandchildren, who are transitioning  back to school. She is considering travel plans, including a potential trip to [place] and a cruise in April.   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 She has two sons who are in school and has recently taken up a job to keep herself occupied. She also mentions working out with a friend who advised her to increase her protein intake, although she feels it does not affect her  hives.    Otherwise, there have been no changes to her past medical history, surgical history, family history, or social history.    Review of systems otherwise negative other than that mentioned in the HPI.    Objective:   Blood pressure 122/72, pulse 82, temperature 98.4 F (36.9 C), temperature source Temporal, resp. rate 16, height 5' 0.75 (1.543 m), weight 144 lb 14.4 oz (65.7 kg), SpO2 100%. Body mass index is 27.6 kg/m.    Physical Exam Vitals reviewed.  Constitutional:      Appearance: She is well-developed.     Comments: Smiling and pleasant.  HENT:     Head: Normocephalic and atraumatic.     Right Ear: Tympanic membrane, ear canal and external ear normal. No drainage, swelling or tenderness. Tympanic membrane is not injected, scarred, erythematous, retracted or bulging.     Left Ear: Tympanic membrane, ear canal and external ear normal. No drainage, swelling or tenderness. Tympanic membrane is not injected, scarred, erythematous, retracted or bulging.     Nose: No nasal deformity, septal deviation, mucosal edema or rhinorrhea.     Right Turbinates: Enlarged, swollen and pale.     Left Turbinates: Enlarged, swollen and pale.     Right Sinus: No maxillary sinus tenderness or frontal sinus tenderness.     Left Sinus: No maxillary sinus tenderness or frontal sinus tenderness.     Comments: No polyps noted.     Mouth/Throat:     Lips: Pink.     Mouth: Mucous membranes are moist. Mucous membranes are not pale and not dry.     Pharynx: Uvula midline.     Comments: She does have some swelling of her lower lip.  Eyes:     General: Lids are normal. Allergic shiner present.        Right eye: No discharge.        Left eye: No discharge.     Conjunctiva/sclera: Conjunctivae normal.     Right eye: Right conjunctiva is not injected. No chemosis.    Left eye: Left conjunctiva is not injected. No chemosis.    Pupils: Pupils are equal, round, and reactive to light.   Cardiovascular:     Rate and Rhythm: Normal rate and regular rhythm.     Heart sounds: Normal heart sounds.  Pulmonary:     Effort: Pulmonary effort is normal. No tachypnea, accessory muscle usage or respiratory distress.     Breath sounds: Normal breath sounds. No wheezing, rhonchi or rales.     Comments: No wheezing or crackles noted.  Chest:     Chest wall: No tenderness.  Abdominal:     Tenderness: There is no abdominal tenderness. There is no guarding or rebound.  Lymphadenopathy:     Head:     Right side of head: No submandibular, tonsillar or occipital adenopathy.     Left side of head: No submandibular, tonsillar or occipital adenopathy.     Cervical: No cervical adenopathy.  Skin:    General: Skin is warm.  Capillary Refill: Capillary refill takes less than 2 seconds.     Coloration: Skin is not pale.     Findings: No abrasion, erythema, petechiae or rash. Rash is not papular, urticarial or vesicular.     Comments: No urticarial lesions noted.   Neurological:     Mental Status: She is alert.  Psychiatric:        Behavior: Behavior is cooperative.      Diagnostic studies: none      Marty Shaggy, MD  [facility] 

## 2024-07-17 ENCOUNTER — Other Ambulatory Visit: Payer: Self-pay | Admitting: Family Medicine

## 2024-07-17 DIAGNOSIS — F410 Panic disorder [episodic paroxysmal anxiety] without agoraphobia: Secondary | ICD-10-CM

## 2024-07-20 NOTE — Telephone Encounter (Unsigned)
 Copied from CRM #[id number]. Topic: Appointments - Appointment Cancel/Reschedule >> Jul 20, 2024  2:19 PM Travis F wrote: Patient is calling in requesting to cancel her DEXA scan appointment. Patient says she has already had this done.

## 2024-07-25 ENCOUNTER — Encounter: Payer: Self-pay | Admitting: Family Medicine

## 2024-07-25 ENCOUNTER — Other Ambulatory Visit

## 2024-07-25 ENCOUNTER — Ambulatory Visit: Admitting: Family Medicine

## 2024-07-25 VITALS — BP 153/88 | HR 69 | Temp 96.8°F | Ht 60.0 in | Wt 144.1 lb

## 2024-07-25 DIAGNOSIS — M810 Age-related osteoporosis without current pathological fracture: Secondary | ICD-10-CM | POA: Diagnosis not present

## 2024-07-25 DIAGNOSIS — F410 Panic disorder [episodic paroxysmal anxiety] without agoraphobia: Secondary | ICD-10-CM

## 2024-07-25 DIAGNOSIS — F411 Generalized anxiety disorder: Secondary | ICD-10-CM | POA: Diagnosis not present

## 2024-07-25 DIAGNOSIS — Z23 Encounter for immunization: Secondary | ICD-10-CM | POA: Diagnosis not present

## 2024-07-25 MED ORDER — ALPRAZOLAM 0.5 MG PO TABS: 0.2500 mg | ORAL_TABLET | Freq: Every evening | ORAL | 1 refills | Status: AC | PRN

## 2024-07-25 NOTE — Patient Instructions (Signed)
 Prolia and Fosamax

## 2024-07-25 NOTE — Progress Notes (Signed)
 Subjective: CC: Medication refills PCP: Jolinda Norene HERO, DO YEP:Karen White is a 68 y.o. female presenting to clinic today for:  Generalized anxiety disorder with panic attack Patient utilizes her Xanax  as needed and typically at bedtime.  She is compliant with her Lexapro  10 mg daily.  She reports that mood has been stable.  She denies any memory changes, dizziness, falls or excessive daytime sedation.  Osteoporosis This was diagnosed on DEXA scan with OB/GYN.  They recommended vitamin D  and thyroid  testing and have advised her to consider Fosamax.  Though she reports some reluctance to take any medication other than what she is already taking due to worries that it may exacerbate her dermatologic disorder.  She does report the dermatologic disorder has been stable on Xolair  and she subsequently has been eating better.  She also has gained weight which brings her a little bit of angst because she stays very physically active and feels like she has a fairly balanced diet.  She is going to be tapering from Allegra  soon, which was a recent addition and switch from her previous antihistamines.   ROS: Per HPI  Allergies  Allergen Reactions   Molds & Smuts Hives and Itching   Past Medical History:  Diagnosis Date   Allergy     Anxiety    Depression    GERD (gastroesophageal reflux disease)    Migraine    Urticaria     Current Outpatient Medications:    dexlansoprazole  (DEXILANT ) 60 MG capsule, Take 1 capsule (60 mg total) by mouth daily., Disp: 90 capsule, Rfl: 3   EPINEPHrine  (EPIPEN  2-PAK) 0.3 mg/0.3 mL IJ SOAJ injection, Inject 0.3 mg into the muscle as needed for anaphylaxis., Disp: 1 each, Rfl: 0   escitalopram  (LEXAPRO ) 10 MG tablet, TAKE ONE (1) TABLET BY MOUTH EVERY DAY, Disp: 90 tablet, Rfl: 1   fexofenadine  (ALLEGRA ) 180 MG tablet, Take 1 tablet (180 mg total) by mouth daily., Disp: 90 tablet, Rfl: 1   omalizumab  (XOLAIR ) 300 MG/2  ML prefilled syringe, Inject 300 mg  into the skin every 14 (fourteen) days., Disp: 12 mL, Rfl: 3   rizatriptan  (MAXALT -MLT) 10 MG disintegrating tablet, PLACE 1 TABLET ON TONGUE AND ALLOW TO DISSOLVE AS NEEDED FOR ONSET OFHEADACHE (Patient taking differently: as needed. PLACE 1 TABLET ON TONGUE AND ALLOW TO DISSOLVE AS NEEDED FOR ONSET OFHEADACHE), Disp: 12 tablet, Rfl: 5   ALPRAZolam  (XANAX ) 0.5 MG tablet, Take 0.5-1 tablets (0.25-0.5 mg total) by mouth at bedtime as needed for anxiety., Disp: 90 tablet, Rfl: 1  Current Facility-Administered Medications:    omalizumab  (XOLAIR ) prefilled syringe 300 mg, 300 mg, Subcutaneous, Q28 days, Iva Marty Saltness, MD, 300 mg at 05/26/23 9089 Social History   Socioeconomic History   Marital status: Single    Spouse name: Not on file   Number of children: Not on file   Years of education: Not on file   Highest education level: Bachelor's degree (e.g., BA, AB, BS)  Occupational History   Not on file  Tobacco Use   Smoking status: Former    Current packs/day: 0.00    Types: Cigarettes    Quit date: 01/06/1976    Years since quitting: 48.5   Smokeless tobacco: Never   Tobacco comments:    Not smoking now  Vaping Use   Vaping status: Never Used  Substance and Sexual Activity   Alcohol use: Yes    Alcohol/week: 2.0 standard drinks of alcohol    Types: 2 Glasses of  wine per week    Comment: One drink a week   Drug use: No   Sexual activity: Not Currently    Birth control/protection: Post-menopausal  Other Topics Concern   Not on file  Social History Narrative   Not on file   Social Drivers of Health   Financial Resource Strain: Low Risk  (07/18/2024)   Overall Financial Resource Strain (CARDIA)    Difficulty of Paying Living Expenses: Not hard at all  Food Insecurity: No Food Insecurity (07/18/2024)   Hunger Vital Sign    Worried About Running Out of Food in the Last Year: Never true    Ran Out of Food in the Last Year: Never true  Transportation Needs: No  Transportation Needs (07/18/2024)   PRAPARE - Administrator, Civil Service (Medical): No    Lack of Transportation (Non-Medical): No  Physical Activity: Insufficiently Active (07/18/2024)   Exercise Vital Sign    Days of Exercise per Week: 4 days    Minutes of Exercise per Session: 30 min  Stress: No Stress Concern Present (07/18/2024)   [employer] of Occupational Health - Occupational Stress Questionnaire    Feeling of Stress: Only a little  Social Connections: Moderately Integrated (07/18/2024)   Social Connection and Isolation Panel    Frequency of Communication with Friends and Family: More than three times a week    Frequency of Social Gatherings with Friends and Family: Three times a week    Attends Religious Services: More than 4 times per year    Active Member of Clubs or Organizations: Yes    Attends Banker Meetings: 1 to 4 times per year    Marital Status: Divorced  Intimate Partner Violence: Not At Risk (10/27/2023)   Humiliation, Afraid, Rape, and Kick questionnaire    Fear of Current or Ex-Partner: No    Emotionally Abused: No    Physically Abused: No    Sexually Abused: No   Family History  Problem Relation Age of Onset   Coronary artery disease Mother        Age 44s   Cancer Mother    Coronary artery disease Father        Age 71s   Heart disease Father    Cancer Maternal Grandmother        ovarian   Colon cancer Neg Hx    Colon polyps Neg Hx    Esophageal cancer Neg Hx    Rectal cancer Neg Hx    Stomach cancer Neg Hx    Allergic rhinitis Neg Hx    Angioedema Neg Hx    Asthma Neg Hx    Eczema Neg Hx    Urticaria Neg Hx     Objective: Office vital signs reviewed. BP (!) 153/88   Pulse 69   Temp (!) 96.8 F (36 C)   Ht 5' (1.524 m)   Wt 144 lb 2 oz (65.4 kg)   SpO2 97%   BMI 28.15 kg/m   Physical Examination:  General: Awake, alert, well nourished, No acute distress HEENT: sclera white, MMM Cardio: regular rate  and rhythm, S1S2 heard, no murmurs appreciated Pulm: clear to auscultation bilaterally, no wheezes, rhonchi or rales; normal work of breathing on room air     07/25/2024    8:28 AM  MMSE - Mini Mental State Exam  Orientation to time 5  Orientation to Place 5  Registration 3  Attention/ Calculation 5  Recall 3  Language- name 2 objects  2  Language- repeat 1  Language- follow 3 step command 3  Language- read & follow direction 1  Write a sentence 1  Copy design 1  Total score 30      07/25/2024    8:13 AM 01/18/2024    8:04 AM 10/27/2023    8:45 AM  Depression screen PHQ 2/9  Decreased Interest 0 0 0  Down, Depressed, Hopeless 0 0 0  PHQ - 2 Score 0 0 0  Altered sleeping 0 2   Tired, decreased energy 0 0   Change in appetite 0 0   Feeling bad or failure about yourself  0 0   Trouble concentrating 0 0   Moving slowly or fidgety/restless 0 0   Suicidal thoughts 0 0   PHQ-9 Score 0 2   Difficult doing work/chores Not difficult at all Not difficult at all       07/25/2024    8:14 AM 01/18/2024    8:05 AM 12/16/2022    9:21 AM 07/24/2022   10:05 AM  GAD 7 : Generalized Anxiety Score  Nervous, Anxious, on Edge 0 0 0 0  Control/stop worrying 0 0 0 0  Worry too much - different things 0 0 0 0  Trouble relaxing 0 0 0 0  Restless 0 0 0 0  Easily annoyed or irritable 0 0 0 0  Afraid - awful might happen 0 0 0 0  Total GAD 7 Score 0 0 0 0  Anxiety Difficulty Not difficult at all Not difficult at all Not difficult at all Not difficult at all   Assessment/ Plan: 68 y.o. female   Generalized anxiety disorder with panic attacks - Plan: ALPRAZolam  (XANAX ) 0.5 MG tablet  Age-related osteoporosis without current pathological fracture - Plan: TSH + free T4, VITAMIN D  25 Hydroxy (Vit-D Deficiency, Fractures)  Immunization due - Plan: Tdap vaccine greater than or equal to 7yo IM   Up-to-date on UDS and CSC and the national narcotic database was reviewed with no red flags.   Thyroid  levels were ordered as well as a repeat vitamin D  in the setting of osteoporosis, T score -3.0 bilateral femoral necks.  Discussed Prolia vs Fosamax.  She will do some reading on them both and let me know if she wants to start anything.  Her results have been scanned into the chart.   Norene CHRISTELLA Fielding, DO [facility] [phone]

## 2024-07-26 ENCOUNTER — Ambulatory Visit: Payer: Self-pay | Admitting: Family Medicine

## 2024-07-26 LAB — TSH+FREE T4
Free T4: 0.92 ng/dL (ref 0.82–1.77)
TSH: 2.46 u[IU]/mL (ref 0.450–4.500)

## 2024-07-26 LAB — VITAMIN D 25 HYDROXY (VIT D DEFICIENCY, FRACTURES): Vit D, 25-Hydroxy: 34.6 ng/mL (ref 30.0–100.0)

## 2024-07-28 ENCOUNTER — Other Ambulatory Visit

## 2024-07-31 ENCOUNTER — Other Ambulatory Visit: Payer: Self-pay | Admitting: *Deleted

## 2024-07-31 MED ORDER — XOLAIR 300 MG/2ML ~~LOC~~ SOSY: 300.0000 mg | PREFILLED_SYRINGE | SUBCUTANEOUS | 3 refills | Status: AC

## 2024-10-16 ENCOUNTER — Other Ambulatory Visit: Payer: Self-pay | Admitting: Family Medicine

## 2024-10-16 DIAGNOSIS — F411 Generalized anxiety disorder: Secondary | ICD-10-CM

## 2024-10-31 ENCOUNTER — Ambulatory Visit

## 2025-01-11 ENCOUNTER — Ambulatory Visit: Admitting: Allergy & Immunology

## 2025-02-20 ENCOUNTER — Encounter: Payer: Self-pay | Admitting: Family Medicine
